# Patient Record
Sex: Female | Born: 1975 | Race: White | Hispanic: No | Smoking: Current every day smoker
Health system: Southern US, Community
[De-identification: ages and names within clinical notes are randomized; demographics above are authoritative.]

## PROBLEM LIST (undated history)

## (undated) DIAGNOSIS — F419 Anxiety disorder, unspecified: Secondary | ICD-10-CM

## (undated) DIAGNOSIS — O09529 Supervision of elderly multigravida, unspecified trimester: Secondary | ICD-10-CM

## (undated) DIAGNOSIS — N390 Urinary tract infection, site not specified: Secondary | ICD-10-CM

## (undated) DIAGNOSIS — D649 Anemia, unspecified: Secondary | ICD-10-CM

## (undated) HISTORY — PX: SALPINGECTOMY: SHX328

## (undated) HISTORY — PX: ABDOMINAL HERNIA REPAIR: SHX539

## (undated) HISTORY — PX: AUGMENTATION MAMMAPLASTY: SUR837

## (undated) HISTORY — PX: BREAST ENHANCEMENT SURGERY: SHX7

---

## 2004-04-18 ENCOUNTER — Other Ambulatory Visit: Payer: Self-pay

## 2006-09-15 DIAGNOSIS — D239 Other benign neoplasm of skin, unspecified: Secondary | ICD-10-CM

## 2006-09-15 HISTORY — DX: Other benign neoplasm of skin, unspecified: D23.9

## 2007-07-20 ENCOUNTER — Encounter: Payer: Self-pay | Admitting: Obstetrics and Gynecology

## 2008-01-15 ENCOUNTER — Ambulatory Visit: Payer: Self-pay | Admitting: Obstetrics and Gynecology

## 2008-01-18 ENCOUNTER — Inpatient Hospital Stay: Payer: Self-pay

## 2010-04-06 ENCOUNTER — Ambulatory Visit: Payer: Self-pay | Admitting: General Practice

## 2010-12-20 ENCOUNTER — Emergency Department: Payer: Self-pay | Admitting: Emergency Medicine

## 2011-08-06 ENCOUNTER — Observation Stay: Payer: Self-pay | Admitting: Obstetrics and Gynecology

## 2011-08-06 LAB — URINALYSIS, COMPLETE
Bilirubin,UR: NEGATIVE
Ph: 6 (ref 4.5–8.0)
Squamous Epithelial: 1
WBC UR: 9 /HPF (ref 0–5)

## 2011-08-06 LAB — HEMOGLOBIN: HGB: 11.2 g/dL — ABNORMAL LOW (ref 12.0–16.0)

## 2011-08-07 LAB — HEMATOCRIT: HCT: 29.9 % — ABNORMAL LOW

## 2012-06-20 ENCOUNTER — Emergency Department: Payer: Self-pay | Admitting: Emergency Medicine

## 2012-06-20 LAB — BASIC METABOLIC PANEL
Anion Gap: 8 (ref 7–16)
Calcium, Total: 8.8 mg/dL (ref 8.5–10.1)
Chloride: 108 mmol/L — ABNORMAL HIGH (ref 98–107)
Co2: 24 mmol/L (ref 21–32)
EGFR (African American): >60
Glucose: 135 mg/dL — ABNORMAL HIGH (ref 65–99)
Osmolality: 282 (ref 275–301)
Potassium: 3.7 mmol/L (ref 3.5–5.1)
Sodium: 140 mmol/L (ref 136–145)

## 2012-06-20 LAB — URINALYSIS, COMPLETE
Bacteria: NONE SEEN
Bilirubin,UR: NEGATIVE
Glucose,UR: NEGATIVE mg/dL (ref 0–75)
Ketone: NEGATIVE
Specific Gravity: 1.02 (ref 1.003–1.030)
WBC UR: 21 /HPF (ref 0–5)

## 2012-06-20 LAB — CBC
HCT: 36.8 % (ref 35.0–47.0)
MCH: 29.4 pg (ref 26.0–34.0)
MCHC: 33.5 g/dL (ref 32.0–36.0)
MCV: 88 fL (ref 80–100)
RBC: 4.19 10*6/uL (ref 3.80–5.20)
RDW: 12.9 % (ref 11.5–14.5)

## 2012-06-22 LAB — URINE CULTURE

## 2012-07-06 DIAGNOSIS — R3129 Other microscopic hematuria: Secondary | ICD-10-CM | POA: Insufficient documentation

## 2012-07-06 DIAGNOSIS — N3020 Other chronic cystitis without hematuria: Secondary | ICD-10-CM | POA: Insufficient documentation

## 2012-07-06 DIAGNOSIS — N3946 Mixed incontinence: Secondary | ICD-10-CM | POA: Insufficient documentation

## 2012-07-06 DIAGNOSIS — N1370 Vesicoureteral-reflux, unspecified: Secondary | ICD-10-CM | POA: Insufficient documentation

## 2012-07-06 DIAGNOSIS — R399 Unspecified symptoms and signs involving the genitourinary system: Secondary | ICD-10-CM | POA: Insufficient documentation

## 2014-11-13 NOTE — Op Note (Signed)
PATIENT NAME:  Christine Abbott, Christine Abbott MR#:  [id number] DATE OF BIRTH:  03-Feb-1976  DATE OF PROCEDURE:  08/06/2011  PREOPERATIVE DIAGNOSES:  1. Abdominal pain.  2. Positive pregnancy test.  3. Adnexal mass.   POSTOPERATIVE DIAGNOSES:  1. Abdominal pain.  2. Positive pregnancy test.  3. Adnexal mass.   PROCEDURE:  1. Dilation and curettage.   2. Diagnostic laparoscopy.  3. Right salpingectomy.   SURGEON: Ricky L. Amalia Hailey, MD   ANESTHESIA: General endotracheal by Dr. Marcello Moores.   FINDINGS: Copious curettings consistent with decidual reaction. Right fallopian tube with accompanying cyst. No evidence of  hemoperitoneum. Grossly normal ovaries. Grossly normal left tube. Grossly normal uterus. Grossly normal appendix.   ESTIMATED BLOOD LOSS: Minimal.   COMPLICATIONS: None.   SPECIMENS: Endometrial curettings and right fallopian tube.   DRAINS: In and out cath with a red rubber catheter at the beginning of the case, approximately 75 mL of clear urine.   PROCEDURE IN DETAIL: The patient presented to the office today complaining of right-sided pain, some mild rebound symptoms. Ultrasound showed no fluid but a 3.5-cm right adnexal mass adjacent to the right ovary. The pregnancy test came back positive for quantitative beta-hCG of over 5200. Given findings and the patient's symptoms, we elected to proceed with the above procedures. We reviewed with the patient and consent was signed.  She was taken to the operating room  after 1 gram of Ancef in IV was given. She was placed in the supine position where anesthesia was initiated. She was then placed in the dorsal lithotomy position using Allen stirrups, prepped and draped in the usual sterile fashion. Cervix was visualized, grasp with a single-tooth tenaculum, easily dilated to permit sharp curettings, which was carried out and specimens were sent for permanent pathology.  The single-tooth tenaculum was changed out for a Hulka tenaculum. The bladder was  drained and we turned our attention to the abdomen.   She is status post an abdominoplasty and had an umbilical mesh placed. I used a 5-mm port and went infraumbilically and no mesh was encountered, apparently going caudad to the edge of the mesh. Pneumoperitoneum was allowed to establish.  The patient was placed in Trendelenburg. Under direct visualization I placed a 5-mm port in the left lower quadrant.   I inspected the pelvis as noted above. I elected to proceed with right salpingectomy as discussed previously, which was carried out alternating Kleppinger and sharp. Pressure was lowered to 6 mmHg. Some additional cauterization in the mesosalpinx to ensure hemostasis was done. The areas were seen to be hemostatic. Ports were removed. Pneumoperitoneum was allowed to resolve. Incisions were closed deep and subcutaneous with 3-0 Vicryl. Steri-Strips and Band-Aids were placed.   All instrument, needle, and sponge counts were correct. The patient tolerated the procedure well. 30 mL of 0.5% Sensorcaine was used in three 10-mL aliquots.   She will be placed on the floor for observation this evening.  I anticipate discharge home tomorrow. We will follow her quants down to zero, pending pathology findings.   ____________________________ Rockey Situ. Amalia Hailey, MD rle:bjt D: 08/06/2011 19:37:41 ET T: 08/07/2011 06:16:53 ET JOB#: [id number]  cc: Audry Pili L. Amalia Hailey, MD, <Dictator> Selmer Dominion MD ELECTRONICALLY SIGNED 08/13/2011 9:49

## 2015-05-29 DIAGNOSIS — E669 Obesity, unspecified: Secondary | ICD-10-CM | POA: Insufficient documentation

## 2015-07-23 DIAGNOSIS — O09529 Supervision of elderly multigravida, unspecified trimester: Secondary | ICD-10-CM

## 2015-07-23 HISTORY — DX: Supervision of elderly multigravida, unspecified trimester: O09.529

## 2015-11-06 DIAGNOSIS — O09521 Supervision of elderly multigravida, first trimester: Secondary | ICD-10-CM | POA: Insufficient documentation

## 2015-11-07 ENCOUNTER — Other Ambulatory Visit: Payer: Self-pay | Admitting: Obstetrics and Gynecology

## 2015-11-07 DIAGNOSIS — Z369 Encounter for antenatal screening, unspecified: Secondary | ICD-10-CM

## 2015-11-19 DIAGNOSIS — Z9889 Other specified postprocedural states: Secondary | ICD-10-CM | POA: Insufficient documentation

## 2015-11-30 ENCOUNTER — Ambulatory Visit: Payer: Self-pay

## 2016-01-12 ENCOUNTER — Other Ambulatory Visit: Payer: Self-pay

## 2016-01-12 DIAGNOSIS — O4442 Low lying placenta NOS or without hemorrhage, second trimester: Secondary | ICD-10-CM

## 2016-01-15 ENCOUNTER — Inpatient Hospital Stay: Admission: RE | Admit: 2016-01-15 | Payer: Self-pay | Source: Ambulatory Visit

## 2016-01-15 ENCOUNTER — Ambulatory Visit: Payer: Self-pay

## 2016-01-15 ENCOUNTER — Ambulatory Visit
Admission: RE | Admit: 2016-01-15 | Discharge: 2016-01-15 | Disposition: A | Discharge status: Home / Self Care | Payer: Medicaid Other | Source: Ambulatory Visit | Attending: Maternal & Fetal Medicine | Admitting: Maternal & Fetal Medicine

## 2016-01-15 DIAGNOSIS — O09522 Supervision of elderly multigravida, second trimester: Secondary | ICD-10-CM | POA: Insufficient documentation

## 2016-01-15 DIAGNOSIS — Z3A20 20 weeks gestation of pregnancy: Secondary | ICD-10-CM | POA: Diagnosis not present

## 2016-01-15 DIAGNOSIS — O4442 Low lying placenta NOS or without hemorrhage, second trimester: Secondary | ICD-10-CM | POA: Diagnosis present

## 2016-01-15 HISTORY — DX: Supervision of elderly multigravida, unspecified trimester: O09.529

## 2016-03-04 ENCOUNTER — Ambulatory Visit
Admission: RE | Admit: 2016-03-04 | Discharge: 2016-03-04 | Disposition: A | Discharge status: Home / Self Care | Payer: Medicaid Other | Source: Ambulatory Visit | Attending: Maternal & Fetal Medicine | Admitting: Maternal & Fetal Medicine

## 2016-03-04 DIAGNOSIS — O4443 Low lying placenta NOS or without hemorrhage, third trimester: Secondary | ICD-10-CM | POA: Diagnosis present

## 2016-03-04 DIAGNOSIS — Z3A28 28 weeks gestation of pregnancy: Secondary | ICD-10-CM | POA: Diagnosis not present

## 2016-03-04 DIAGNOSIS — O4442 Low lying placenta NOS or without hemorrhage, second trimester: Secondary | ICD-10-CM

## 2016-05-20 NOTE — H&P (Signed)
Christine Abbott is a 40 y.o. female presenting for elective repeat c/s  And unilateral tubal ligation . EDC 05/28/16 . OB History    Gravida Para Term Preterm AB Living   5 3 3   1 3    SAB TAB Ectopic Multiple Live Births       1   3     Past Medical History:  Diagnosis Date  . Advanced maternal age in multigravida 2017   Past Surgical History:  Procedure Laterality Date  . ABDOMINAL HERNIA REPAIR    . BREAST ENHANCEMENT SURGERY    . CESAREAN SECTION    . SALPINGECTOMY     Family History: family history is not on file. Social History:  reports that she has quit smoking. She has never used smokeless tobacco. She reports that she does not drink alcohol or use drugs.     Maternal Diabetes: No Genetic Screening: Normal Maternal Ultrasounds/Referrals: Normal, MFM u/s shows no evidence of her anterior placenta communicating with the lower uterine segment  Fetal Ultrasounds or other Referrals:  None Maternal Substance Abuse:  No Significant Maternal Medications:  None Significant Maternal Lab Results:  None Other Comments:  None  ROS Historys/p right salpingectomy in past    Last menstrual period 08/28/2015. Exam  Lungs CTA  CV RRR Abd gravid  Physical Exam  Prenatal labs: ABO, Rh:  O+ Antibody:  neg Rubella:  NOn IMMune RPR:   neg  HBsAg:   neg  HIV:   neg GBS:   neg  Assessment/Plan: Elective repeat LTCS and left tubal for sterilization    SCHERMERHORN,THOMAS 05/20/2016, 9:30 AM

## 2016-05-21 ENCOUNTER — Other Ambulatory Visit: Payer: Medicaid Other

## 2016-05-23 ENCOUNTER — Encounter
Admission: RE | Admit: 2016-05-23 | Discharge: 2016-05-23 | Disposition: A | Discharge status: Home / Self Care | Payer: Medicaid Other | Source: Ambulatory Visit | Attending: Obstetrics and Gynecology | Admitting: Obstetrics and Gynecology

## 2016-05-23 HISTORY — DX: Anemia, unspecified: D64.9

## 2016-05-23 HISTORY — DX: Anxiety disorder, unspecified: F41.9

## 2016-05-23 LAB — RAPID HIV SCREEN (HIV 1/2 AB+AG)
HIV 1/2 ANTIBODIES: NONREACTIVE
HIV-1 P24 ANTIGEN - HIV24: NONREACTIVE

## 2016-05-23 LAB — CBC
HCT: 34.7 % — ABNORMAL LOW (ref 35.0–47.0)
Hemoglobin: 12 g/dL (ref 12.0–16.0)
MCH: 30 pg (ref 26.0–34.0)
MCHC: 34.7 g/dL (ref 32.0–36.0)
MCV: 86.7 fL (ref 80.0–100.0)
PLATELETS: 266 10*3/uL (ref 150–440)
RBC: 4 MIL/uL (ref 3.80–5.20)
RDW: 13.5 % (ref 11.5–14.5)
WBC: 11.1 10*3/uL — AB (ref 3.6–11.0)

## 2016-05-23 LAB — SURGICAL PCR SCREEN
MRSA, PCR: POSITIVE — AB
Staphylococcus aureus: POSITIVE — AB

## 2016-05-23 LAB — TYPE AND SCREEN
ABO/RH(D): O POS
Antibody Screen: NEGATIVE
EXTEND SAMPLE REASON: UNDETERMINED

## 2016-05-23 NOTE — Patient Instructions (Signed)
Your procedure is scheduled on: 05/24/16 Report to [facility] AT 5:45 [facility] EMERGENCY DEPT   Remember: Instructions that are not followed completely may result in serious medical risk, up to and including death, or upon the discretion of your surgeon and anesthesiologist your surgery may need to be rescheduled.    __X__ 1. Do not eat food or drink liquids after midnight. No gum chewing or hard candies.     __X__ 2. No Alcohol for 24 hours before or after surgery.   ____ 3. Bring all medications with you on the day of surgery if instructed.    __X__ 4. Notify your doctor if there is any change in your medical condition     (cold, fever, infections).     Do not wear jewelry, make-up, hairpins, clips or nail polish.  Do not wear lotions, powders, or perfumes.   Do not shave 48 hours prior to surgery. Men may shave face and neck.  Do not bring valuables to the hospital.    [facility] is not responsible for any belongings or valuables.               Contacts, dentures or bridgework may not be worn into surgery.  Leave your suitcase in the car. After surgery it may be brought to your room.  For patients admitted to the hospital, discharge time is determined by your                treatment team.   Patients discharged the day of surgery will not be allowed to drive home.   Please read over the following fact sheets that you were given:   Pain Booklet and MRSA Information   ____ Take these medicines the morning of surgery with A SIP OF WATER:    1.   2.   3.   4.  5.  6.  ____ Fleet Enema (as directed)   __X__ Use CHG Soap as directed/SAGE WIPES  ____ Use inhalers on the day of surgery  ____ Stop metformin 2 days prior to surgery    ____ Take 1/2 of usual insulin dose the night before surgery and none on the morning of surgery.   ____ Stop Coumadin/Plavix/aspirin on   ____ Stop Anti-inflammatories on    ____ Stop supplements until after surgery.    ____ Bring C-Pap to  the hospital.

## 2016-05-23 NOTE — Pre-Procedure Instructions (Signed)
[facility]irthplace notified patient PCR positive for MRSA, results faxed to Dr Ouida Sills.

## 2016-05-24 ENCOUNTER — Inpatient Hospital Stay: Payer: Medicaid Other | Admitting: Anesthesiology

## 2016-05-24 ENCOUNTER — Inpatient Hospital Stay
Admission: RE | Admit: 2016-05-24 | Discharge: 2016-05-27 | DRG: 765 | Disposition: A | Discharge status: Home / Self Care | Payer: Medicaid Other | Source: Ambulatory Visit | Attending: Obstetrics and Gynecology | Admitting: Obstetrics and Gynecology

## 2016-05-24 ENCOUNTER — Encounter
Admission: RE | Disposition: A | Discharge status: Home / Self Care | Payer: Self-pay | Source: Ambulatory Visit | Attending: Obstetrics and Gynecology

## 2016-05-24 DIAGNOSIS — K567 Ileus, unspecified: Secondary | ICD-10-CM | POA: Diagnosis not present

## 2016-05-24 DIAGNOSIS — O9081 Anemia of the puerperium: Secondary | ICD-10-CM | POA: Diagnosis not present

## 2016-05-24 DIAGNOSIS — Z3A39 39 weeks gestation of pregnancy: Secondary | ICD-10-CM | POA: Diagnosis not present

## 2016-05-24 DIAGNOSIS — K9189 Other postprocedural complications and disorders of digestive system: Secondary | ICD-10-CM | POA: Diagnosis not present

## 2016-05-24 DIAGNOSIS — Z302 Encounter for sterilization: Secondary | ICD-10-CM

## 2016-05-24 DIAGNOSIS — D62 Acute posthemorrhagic anemia: Secondary | ICD-10-CM | POA: Diagnosis not present

## 2016-05-24 DIAGNOSIS — Z9889 Other specified postprocedural states: Secondary | ICD-10-CM

## 2016-05-24 DIAGNOSIS — Z87891 Personal history of nicotine dependence: Secondary | ICD-10-CM | POA: Diagnosis not present

## 2016-05-24 DIAGNOSIS — O34211 Maternal care for low transverse scar from previous cesarean delivery: Principal | ICD-10-CM | POA: Diagnosis present

## 2016-05-24 LAB — RPR: RPR Ser Ql: NONREACTIVE

## 2016-05-24 SURGERY — Surgical Case
Anesthesia: Spinal | Laterality: Left | Wound class: Clean Contaminated

## 2016-05-24 MED ORDER — WITCH HAZEL-GLYCERIN EX PADS: 1.0000 "application " | MEDICATED_PAD | CUTANEOUS | Status: DC | PRN

## 2016-05-24 MED ORDER — IBUPROFEN 600 MG PO TABS
600.0000 mg | ORAL_TABLET | Freq: Four times a day (QID) | ORAL | Status: DC | PRN
  Administered 2016-05-27: 600 mg via ORAL
  Filled 2016-05-24: qty 1

## 2016-05-24 MED ORDER — BUPIVACAINE 0.25 % ON-Q PUMP DUAL CATH 400 ML
400.0000 mL | INJECTION | Status: DC
  Filled 2016-05-24: qty 400

## 2016-05-24 MED ORDER — COCONUT OIL OIL: 1.0000 "application " | TOPICAL_OIL | Status: DC | PRN

## 2016-05-24 MED ORDER — BUPIVACAINE HCL (PF) 0.5 % IJ SOLN
INTRAMUSCULAR | Status: AC
  Filled 2016-05-24: qty 30

## 2016-05-24 MED ORDER — ZOLPIDEM TARTRATE 5 MG PO TABS: 5.0000 mg | ORAL_TABLET | Freq: Every evening | ORAL | Status: DC | PRN

## 2016-05-24 MED ORDER — ONDANSETRON HCL 4 MG/2ML IJ SOLN
4.0000 mg | Freq: Once | INTRAMUSCULAR | Status: AC | PRN
Start: 2016-05-24 — End: 2016-05-25
  Administered 2016-05-25: 4 mg via INTRAVENOUS
  Filled 2016-05-24: qty 2

## 2016-05-24 MED ORDER — ONDANSETRON HCL 4 MG/2ML IJ SOLN
4.0000 mg | Freq: Three times a day (TID) | INTRAMUSCULAR | Status: DC | PRN
  Administered 2016-05-25: 4 mg via INTRAVENOUS
  Filled 2016-05-24: qty 2

## 2016-05-24 MED ORDER — DIBUCAINE 1 % RE OINT: 1.0000 "application " | TOPICAL_OINTMENT | RECTAL | Status: DC | PRN

## 2016-05-24 MED ORDER — SOD CITRATE-CITRIC ACID 500-334 MG/5ML PO SOLN
30.0000 mL | Freq: Once | ORAL | Status: AC
  Administered 2016-05-24: 30 mL via ORAL
  Filled 2016-05-24: qty 15

## 2016-05-24 MED ORDER — EPHEDRINE SULFATE-NACL 50-0.9 MG/10ML-% IV SOSY
PREFILLED_SYRINGE | INTRAVENOUS | Status: DC | PRN
  Administered 2016-05-24: 10 mg via INTRAVENOUS

## 2016-05-24 MED ORDER — OXYCODONE-ACETAMINOPHEN 5-325 MG PO TABS: 1.0000 | ORAL_TABLET | ORAL | Status: DC | PRN

## 2016-05-24 MED ORDER — OXYTOCIN 40 UNITS IN LACTATED RINGERS INFUSION - SIMPLE MED
INTRAVENOUS | Status: DC | PRN
  Administered 2016-05-24: 20 mL via INTRAVENOUS
  Administered 2016-05-24: 40 mL via INTRAVENOUS

## 2016-05-24 MED ORDER — SIMETHICONE 80 MG PO CHEW
80.0000 mg | CHEWABLE_TABLET | ORAL | Status: DC
  Administered 2016-05-25: 80 mg via ORAL

## 2016-05-24 MED ORDER — NALBUPHINE HCL 10 MG/ML IJ SOLN: 5.0000 mg | INTRAMUSCULAR | Status: DC | PRN

## 2016-05-24 MED ORDER — NALBUPHINE HCL 10 MG/ML IJ SOLN: 5.0000 mg | Freq: Once | INTRAMUSCULAR | Status: DC | PRN

## 2016-05-24 MED ORDER — SIMETHICONE 80 MG PO CHEW
80.0000 mg | CHEWABLE_TABLET | Freq: Three times a day (TID) | ORAL | Status: DC
  Administered 2016-05-25 – 2016-05-27 (×8): 80 mg via ORAL
  Filled 2016-05-24 (×10): qty 1

## 2016-05-24 MED ORDER — LACTATED RINGERS IV SOLN
INTRAVENOUS | Status: DC
  Administered 2016-05-24: 08:00:00 via INTRAVENOUS

## 2016-05-24 MED ORDER — LACTATED RINGERS IV SOLN
Freq: Once | INTRAVENOUS | Status: AC
  Administered 2016-05-24: 06:00:00 via INTRAVENOUS

## 2016-05-24 MED ORDER — SIMETHICONE 80 MG PO CHEW: 80.0000 mg | CHEWABLE_TABLET | ORAL | Status: DC | PRN

## 2016-05-24 MED ORDER — FENTANYL CITRATE (PF) 100 MCG/2ML IJ SOLN
INTRAMUSCULAR | Status: AC
  Administered 2016-05-24: 25 ug via INTRAVENOUS
  Filled 2016-05-24: qty 2

## 2016-05-24 MED ORDER — ACETAMINOPHEN 325 MG PO TABS: 650.0000 mg | ORAL_TABLET | ORAL | Status: DC | PRN

## 2016-05-24 MED ORDER — DIPHENHYDRAMINE HCL 25 MG PO CAPS: 25.0000 mg | ORAL_CAPSULE | Freq: Four times a day (QID) | ORAL | Status: DC | PRN

## 2016-05-24 MED ORDER — MEPERIDINE HCL 25 MG/ML IJ SOLN: 6.2500 mg | INTRAMUSCULAR | Status: DC | PRN

## 2016-05-24 MED ORDER — MENTHOL 3 MG MT LOZG: 1.0000 | LOZENGE | OROMUCOSAL | Status: DC | PRN

## 2016-05-24 MED ORDER — DIPHENHYDRAMINE HCL 25 MG PO CAPS: 25.0000 mg | ORAL_CAPSULE | ORAL | Status: DC | PRN

## 2016-05-24 MED ORDER — OXYCODONE-ACETAMINOPHEN 5-325 MG PO TABS
2.0000 | ORAL_TABLET | ORAL | Status: DC | PRN
  Administered 2016-05-25: 2 via ORAL
  Filled 2016-05-24: qty 2

## 2016-05-24 MED ORDER — NALOXONE HCL 0.4 MG/ML IJ SOLN: 0.4000 mg | INTRAMUSCULAR | Status: DC | PRN

## 2016-05-24 MED ORDER — BUPIVACAINE ON-Q PAIN PUMP (FOR ORDER SET NO CHG)
INJECTION | Status: AC
  Filled 2016-05-24: qty 1

## 2016-05-24 MED ORDER — PRENATAL MULTIVITAMIN CH
1.0000 | ORAL_TABLET | Freq: Every day | ORAL | Status: DC
  Administered 2016-05-27: 1 via ORAL
  Filled 2016-05-24 (×2): qty 1

## 2016-05-24 MED ORDER — DIPHENHYDRAMINE HCL 50 MG/ML IJ SOLN: 12.5000 mg | INTRAMUSCULAR | Status: DC | PRN

## 2016-05-24 MED ORDER — MORPHINE SULFATE (PF) 0.5 MG/ML IJ SOLN
INTRAMUSCULAR | Status: DC | PRN
  Administered 2016-05-24: .1 mg via EPIDURAL

## 2016-05-24 MED ORDER — PHENYLEPHRINE HCL 10 MG/ML IJ SOLN
INTRAMUSCULAR | Status: DC | PRN
  Administered 2016-05-24 (×5): 100 ug via INTRAVENOUS

## 2016-05-24 MED ORDER — NALOXONE HCL 2 MG/2ML IJ SOSY
1.0000 ug/kg/h | PREFILLED_SYRINGE | INTRAVENOUS | Status: DC | PRN
  Filled 2016-05-24: qty 2

## 2016-05-24 MED ORDER — PHENYLEPHRINE 40 MCG/ML (10ML) SYRINGE FOR IV PUSH (FOR BLOOD PRESSURE SUPPORT)
PREFILLED_SYRINGE | INTRAVENOUS | Status: DC | PRN
  Administered 2016-05-24 (×4): 100 ug via INTRAVENOUS

## 2016-05-24 MED ORDER — OXYTOCIN 40 UNITS IN LACTATED RINGERS INFUSION - SIMPLE MED
2.5000 [IU]/h | INTRAVENOUS | Status: AC
  Filled 2016-05-24: qty 1000

## 2016-05-24 MED ORDER — LACTATED RINGERS IV SOLN
INTRAVENOUS | Status: DC
  Administered 2016-05-24 – 2016-05-27 (×6): via INTRAVENOUS

## 2016-05-24 MED ORDER — TETANUS-DIPHTH-ACELL PERTUSSIS 5-2.5-18.5 LF-MCG/0.5 IM SUSP: 0.5000 mL | Freq: Once | INTRAMUSCULAR | Status: DC

## 2016-05-24 MED ORDER — SCOPOLAMINE 1 MG/3DAYS TD PT72
1.0000 | MEDICATED_PATCH | Freq: Once | TRANSDERMAL | Status: DC
  Administered 2016-05-25: 1.5 mg via TRANSDERMAL
  Filled 2016-05-24: qty 1

## 2016-05-24 MED ORDER — SODIUM CHLORIDE 0.9% FLUSH: 3.0000 mL | INTRAVENOUS | Status: DC | PRN

## 2016-05-24 MED ORDER — CEFAZOLIN SODIUM-DEXTROSE 2-4 GM/100ML-% IV SOLN
2.0000 g | INTRAVENOUS | Status: AC
  Administered 2016-05-24: 2 g via INTRAVENOUS
  Filled 2016-05-24: qty 100

## 2016-05-24 MED ORDER — MORPHINE SULFATE (PF) 2 MG/ML IV SOLN: 1.0000 mg | INTRAVENOUS | Status: AC | PRN

## 2016-05-24 MED ORDER — BUPIVACAINE HCL (PF) 0.5 % IJ SOLN
INTRAMUSCULAR | Status: DC | PRN
  Administered 2016-05-24: 10 mL

## 2016-05-24 MED ORDER — FENTANYL CITRATE (PF) 100 MCG/2ML IJ SOLN
25.0000 ug | INTRAMUSCULAR | Status: DC | PRN
  Administered 2016-05-24 (×2): 25 ug via INTRAVENOUS

## 2016-05-24 MED ORDER — SENNOSIDES-DOCUSATE SODIUM 8.6-50 MG PO TABS
2.0000 | ORAL_TABLET | ORAL | Status: DC
  Administered 2016-05-25: 2 via ORAL
  Filled 2016-05-24: qty 2

## 2016-05-24 MED ORDER — IBUPROFEN 600 MG PO TABS
600.0000 mg | ORAL_TABLET | Freq: Four times a day (QID) | ORAL | Status: DC
  Administered 2016-05-24 – 2016-05-25 (×4): 600 mg via ORAL
  Filled 2016-05-24 (×4): qty 1

## 2016-05-24 SURGICAL SUPPLY — 23 items
BARRIER ADHS 3X4 INTERCEED (GAUZE/BANDAGES/DRESSINGS) ×2 IMPLANT
CANISTER SUCT 3000ML (MISCELLANEOUS) ×2 IMPLANT
CATH KIT ON-Q SILVERSOAK 5IN (CATHETERS) ×4 IMPLANT
CHLORAPREP W/TINT 26ML (MISCELLANEOUS) ×2 IMPLANT
DRSG TELFA 3X8 NADH (GAUZE/BANDAGES/DRESSINGS) ×2 IMPLANT
ELECT CAUTERY BLADE 6.4 (BLADE) ×2 IMPLANT
ELECT REM PT RETURN 9FT ADLT (ELECTROSURGICAL) ×2
ELECTRODE REM PT RTRN 9FT ADLT (ELECTROSURGICAL) ×1 IMPLANT
GAUZE SPONGE 4X4 12PLY STRL (GAUZE/BANDAGES/DRESSINGS) ×2 IMPLANT
GLOVE BIO SURGEON STRL SZ8 (GLOVE) ×2 IMPLANT
GOWN STRL REUS W/ TWL LRG LVL3 (GOWN DISPOSABLE) ×2 IMPLANT
GOWN STRL REUS W/ TWL XL LVL3 (GOWN DISPOSABLE) ×1 IMPLANT
GOWN STRL REUS W/TWL LRG LVL3 (GOWN DISPOSABLE) ×2
GOWN STRL REUS W/TWL XL LVL3 (GOWN DISPOSABLE) ×1
NS IRRIG 1000ML POUR BTL (IV SOLUTION) ×2 IMPLANT
PACK C SECTION AR (MISCELLANEOUS) ×2 IMPLANT
PAD OB MATERNITY 4.3X12.25 (PERSONAL CARE ITEMS) ×2 IMPLANT
PAD PREP 24X41 OB/GYN DISP (PERSONAL CARE ITEMS) ×2 IMPLANT
STAPLER INSORB 30 2030 C-SECTI (MISCELLANEOUS) ×2 IMPLANT
STRAP SAFETY BODY (MISCELLANEOUS) ×2 IMPLANT
SUT CHROMIC 1 CTX 36 (SUTURE) ×6 IMPLANT
SUT PLAIN GUT 0 (SUTURE) ×2 IMPLANT
SUT VIC AB 0 CT1 36 (SUTURE) ×4 IMPLANT

## 2016-05-24 NOTE — Anesthesia Procedure Notes (Addendum)
Spinal  Start time: 05/24/2016 7:52 AM End time: 05/24/2016 7:54 AM Staffing Anesthesiologist: Alvin Critchley Performed: anesthesiologist  Preanesthetic Checklist Completed: patient identified, site marked, surgical consent, pre-op evaluation, timeout performed, IV checked, risks and benefits discussed and monitors and equipment checked Spinal Block Patient position: sitting Prep: Betadine and site prepped and draped Patient monitoring: cardiac monitor, heart rate, continuous pulse ox and blood pressure Approach: midline Location: L4-5 Injection technique: single-shot Needle Needle type: Whitacre  Needle gauge: 25 G Needle length: 9 cm Additional Notes Time out called.  Patient placed in sitting position.  Back prepped and draped in sterile fashion.  A skin wheal was made in the L4-L5 interspace with 1% Lidocaine plain.  A 25G Whit needle was guided into the subarachnoid space with return of clear, colorless CSF in all 4 quadrants.  No blood or paresthesias.  12 mg of spinal marcaine + 0.1 mg of Astromorph was injected .  Patient tolerated the procedure well.

## 2016-05-24 NOTE — Op Note (Signed)
Christine Abbott, Christine Abbott               ACCOUNT NO.:  [id number]  MEDICAL RECORD NO.:  [id number]  LOCATION:                                 FACILITY:  PHYSICIAN:  Laverta Baltimore, MDDATE OF BIRTH:  April 23, 1976  DATE OF PROCEDURE: DATE OF DISCHARGE:                              OPERATIVE REPORT   PREOPERATIVE DIAGNOSIS: 1. Elective repeat cesarean section. 2. 39+ 3 weeks estimated gestational age. 3. Elective unilateral tubal ligation.  POSTOPERATIVE DIAGNOSIS: 1. Elective repeat cesarean section. 2. 39+ 3 weeks estimated gestational age. 3. Elective unilateral tubal ligation.  PROCEDURE PERFORMED: 1. Repeat low transverse cesarean section. 2. Left partial salpingectomy. 3. On-Q pump placement.  SURGEON:  Laverta Baltimore, MD  ANESTHESIA:  Spinal.  SURGEON:  Laverta Baltimore, MD  FIRST ASSISTANTTiburcio Pea, scrub tech.  INDICATION:  A 40 year old, gravida 5, para 3 patient with previous cesarean sections.  The patient elects for repeat cesarean section and sterilization.  The patient reconfirms those desires, the date of the procedure.  Patient has had a prior right salpingectomy.  DESCRIPTION OF PROCEDURE:  After adequate spinal anesthesia, patient was placed in dorsal supine position.  Hip rolled on the right side.  The patient's abdomen was prepped and draped in normal sterile fashion.  The patient did receive 2 g IV Ancef prior to commencement of the case. Time-out was performed.  A Pfannenstiel incision was made 2 fingerbreadths above the symphysis pubis.  Sharp dissection was used to identify the fascia.  Fascia was opened in the midline and opened in a transverse fashion.  The superior aspect of the fascia was grasped with Kocher clamps and the recti muscles dissected free.  Inferior aspect of the fascia was grasped with Kocher clamps and the pyramidalis muscle was dissected free.  Peritoneal cavity was opened sharply and a low- transverse uterine incision  was made through an attenuated lower uterine segment.  Upon entry into the endometrial cavity, clear fluid resulted. The incision was extended with blunt transverse traction.  Fetal head was brought to the incision and head shoulders and body were delivered without difficulty.  The time of birth was 8:05 on May 24, 2016. Infant was dried on the abdomen and oropharynx was suctioned.  After 60 seconds of delayed cord clamping, the cord was clamped and vigorous female was passed to nursery staff who assigned Apgar scores of 9 and 9.  Cord blood was taken.  The placenta was manually delivered and the uterus was exteriorized and the endometrial cavity was wiped clean with laparotomy tape.  The cervix was opened with a ring forceps and the uterine incision was then closed with 1 chromic suture in a running locking fashion.  Good approximation of edges.  Good hemostasis noted. Attention was directed to the left fallopian tube which was grasped at the midportion with a Babcock clamp.  Two separate 0 plain gut sutures were placed around the fallopian tube and a 1.5 cm portion of fallopian tube was removed.  The right fallopian tube was absent.  Ovaries appeared normal.  Posterior cul-de-sac was irrigated and suctioned and the uterus was placed back into the abdominal cavity.  The paracolic gutters were wiped clean with  laparotomy tape.  The tubal ligation site appeared hemostatic as well as the uterine incision.  Interceed was placed over the uterine incision in a T-shaped fashion.  The superior aspect of the fascia was regrasped with Kocher clamps and the On-Q pump catheters were advanced from infraumbilical position to a subfascial position.  Each catheter was then laid beneath the fascia and the fascia was closed over top the catheters with 0 Vicryl suture in a running nonlocking fashion.  Good hemostasis was noted.  Subcutaneous tissues were released due to prior scar tissue and Bovie for  hemostasis and the skin was reapproximated with staples.  Good cosmetic effect.  Each On-Q pump catheter was then secured at the skin level with Dermabond and Steri-Strips to the skin and Tegaderm was placed over the 2 catheters and each catheter was loaded with 5 mL of 0.5% Marcaine.  There were no complications.  ESTIMATED BLOOD LOSS:  400 mL.  URINE OUTPUT:  200 mL.  INTRAOPERATIVE FLUIDS:  1000 mL.  Patient tolerated the procedure well, was taken to recovery room in good condition.    ______________________________ Laverta Baltimore, MD   ______________________________ Laverta Baltimore, MD    TS/MEDQ  D:  05/24/2016  T:  05/24/2016  Job:  [id number]

## 2016-05-24 NOTE — Anesthesia Preprocedure Evaluation (Signed)
Anesthesia Evaluation  Patient identified by MRN, date of birth, ID band Patient awake    Reviewed: Allergy & Precautions, NPO status , Patient's Chart, lab work & pertinent test results  Airway Mallampati: II       Dental no notable dental hx.    Pulmonary former smoker,    Pulmonary exam normal        Cardiovascular negative cardio ROS Normal cardiovascular exam     Neuro/Psych Anxiety negative neurological ROS     GI/Hepatic negative GI ROS, Neg liver ROS,   Endo/Other  negative endocrine ROS  Renal/GU negative Renal ROS  negative genitourinary   Musculoskeletal negative musculoskeletal ROS (+)   Abdominal Normal abdominal exam  (+)   Peds negative pediatric ROS (+)  Hematology  (+) anemia ,   Anesthesia Other Findings   Reproductive/Obstetrics (+) Pregnancy                             Anesthesia Physical Anesthesia Plan  ASA: II  Anesthesia Plan: Spinal   Post-op Pain Management:    Induction:   Airway Management Planned: Nasal Cannula  Additional Equipment:   Intra-op Plan:   Post-operative Plan:   Informed Consent: I have reviewed the patients History and Physical, chart, labs and discussed the procedure including the risks, benefits and alternatives for the proposed anesthesia with the patient or authorized representative who has indicated his/her understanding and acceptance.   Dental advisory given  Plan Discussed with: CRNA and Surgeon  Anesthesia Plan Comments:         Anesthesia Quick Evaluation

## 2016-05-24 NOTE — Anesthesia Procedure Notes (Signed)
Date/Time: 05/24/2016 7:40 AM Performed by: Allean Found Pre-anesthesia Checklist: Patient identified, Emergency Drugs available, Suction available, Patient being monitored and Timeout performed Patient Re-evaluated:Patient Re-evaluated prior to inductionOxygen Delivery Method: Nasal cannula

## 2016-05-24 NOTE — Brief Op Note (Signed)
05/24/2016  8:41 AM  PATIENT:  Christine Abbott  40 y.o. female  PRE-OPERATIVE DIAGNOSIS:  Prior C-Section  Unilateral Tubal Ligation  POST-OPERATIVE DIAGNOSIS: same  PROCEDURE:  Procedure(s): CESAREAN SECTION WITH BILATERAL TUBAL LIGATION (Left) LTCS + left tubal ligation , on q placement  SURGEON:  Surgeon(s) and Role:    Boykin Nearing, MD - Primary  PHYSICIAN ASSISTANT: Brame , scrub tech   ASSISTANTS: none   ANESTHESIA:   spinal  Vigorous female delivered at [facility] on 05/24/16 . APGARS 9/9 . Weight #7/13 EBL:  Total I/O In: 1000 [I.V.:1000] Out: 600 [Urine:200; Blood:400]  BLOOD ADMINISTERED:none  DRAINS: Urinary Catheter (Foley)   LOCAL MEDICATIONS USED:  MARCAINE   , on q pump   SPECIMEN:  Source of Specimen:  left fallopian tube , portion   DISPOSITION OF SPECIMEN:  PATHOLOGY  COUNTS:  YES  TOURNIQUET:  * No tourniquets in log *  DICTATION: .Other Dictation: Dictation Number verbal   PLAN OF CARE: Admit to inpatient   PATIENT DISPOSITION:  PACU - hemodynamically stable.   Delay start of Pharmacological VTE agent (>24hrs) due to surgical blood loss or risk of bleeding: not applicable

## 2016-05-24 NOTE — Progress Notes (Signed)
Patient ID: Christine Abbott, female   DOB: [birth year], 40 y.o.   MRN: [id number] Ready for repeat LTCS and unilateral tubal . MRSA + no active infections . All questions answered . Ready to proceed

## 2016-05-24 NOTE — Transfer of Care (Signed)
Immediate Anesthesia Transfer of Care Note  Patient: Christine Abbott  Procedure(s) Performed: Procedure(s): CESAREAN SECTION WITH UNILATERAL TUBAL LIGATION (Left)  Patient Location: PACU  Anesthesia Type:General  Level of Consciousness: awake  Airway & Oxygen Therapy: Patient Spontanous Breathing and Patient connected to nasal cannula oxygen  Post-op Assessment: Report given to RN and Post -op Vital signs reviewed and stable  Post vital signs: Reviewed and stable  Last Vitals:  Vitals:   05/24/16 0605 05/24/16 0847  BP:  (!) 103/56  Pulse:  99  Resp:  16  Temp: 36.8 C (!) 35.8 C    Last Pain:  Vitals:   05/24/16 0847  TempSrc: Axillary         Complications: No apparent anesthesia complications

## 2016-05-25 LAB — CBC
HCT: 33.3 % — ABNORMAL LOW (ref 35.0–47.0)
Hemoglobin: 11.5 g/dL — ABNORMAL LOW (ref 12.0–16.0)
MCH: 30 pg (ref 26.0–34.0)
MCHC: 34.7 g/dL (ref 32.0–36.0)
MCV: 86.4 fL (ref 80.0–100.0)
PLATELETS: 244 10*3/uL (ref 150–440)
RBC: 3.85 MIL/uL (ref 3.80–5.20)
RDW: 13.7 % (ref 11.5–14.5)
WBC: 16.8 10*3/uL — ABNORMAL HIGH (ref 3.6–11.0)

## 2016-05-25 LAB — ABO/RH: ABO/RH(D): O POS

## 2016-05-25 MED ORDER — ACETAMINOPHEN 10 MG/ML IV SOLN
1000.0000 mg | Freq: Three times a day (TID) | INTRAVENOUS | Status: AC
  Administered 2016-05-25 – 2016-05-26 (×3): 1000 mg via INTRAVENOUS
  Filled 2016-05-25 (×4): qty 100

## 2016-05-25 MED ORDER — KETOROLAC TROMETHAMINE 30 MG/ML IJ SOLN
30.0000 mg | Freq: Once | INTRAMUSCULAR | Status: AC
  Administered 2016-05-25: 30 mg via INTRAVENOUS
  Filled 2016-05-25: qty 1

## 2016-05-25 MED ORDER — PHENOL 1.4 % MT LIQD
1.0000 | OROMUCOSAL | Status: DC | PRN
  Filled 2016-05-25: qty 177

## 2016-05-25 MED ORDER — KETOROLAC TROMETHAMINE 30 MG/ML IJ SOLN
30.0000 mg | Freq: Four times a day (QID) | INTRAMUSCULAR | Status: AC
  Administered 2016-05-26 (×3): 30 mg via INTRAVENOUS
  Filled 2016-05-25 (×3): qty 1

## 2016-05-25 MED ORDER — LORAZEPAM 2 MG/ML IJ SOLN
0.5000 mg | Freq: Four times a day (QID) | INTRAMUSCULAR | Status: DC | PRN
  Administered 2016-05-25 – 2016-05-26 (×2): 0.5 mg via INTRAVENOUS
  Filled 2016-05-25 (×2): qty 1

## 2016-05-25 MED ORDER — LACTATED RINGERS IV BOLUS (SEPSIS)
500.0000 mL | Freq: Once | INTRAVENOUS | Status: AC
  Administered 2016-05-25: 500 mL via INTRAVENOUS

## 2016-05-25 MED ORDER — HYDROMORPHONE HCL 1 MG/ML IJ SOLN
0.6000 mg | Freq: Once | INTRAMUSCULAR | Status: AC
  Administered 2016-05-25: 0.6 mg via INTRAVENOUS
  Filled 2016-05-25: qty 1

## 2016-05-25 MED ORDER — HYDROMORPHONE HCL 1 MG/ML IJ SOLN
1.0000 mg | INTRAMUSCULAR | Status: DC | PRN
  Administered 2016-05-25 – 2016-05-27 (×4): 1 mg via INTRAVENOUS
  Filled 2016-05-25 (×4): qty 1

## 2016-05-25 NOTE — Anesthesia Postprocedure Evaluation (Signed)
Anesthesia Post Note  Patient: MARGUERETTE GARSKI  Procedure(s) Performed: Procedure(s) (LRB): CESAREAN SECTION WITH UNILATERAL TUBAL LIGATION (Left)  Patient location during evaluation: Mother Baby Anesthesia Type: Spinal Level of consciousness: awake, awake and alert and oriented Pain management: pain level controlled Vital Signs Assessment: post-procedure vital signs reviewed and stable Respiratory status: spontaneous breathing Postop Assessment: no headache, no backache and patient able to bend at knees Anesthetic complications: no Comments: Patient apparently has an ileus with some abdominal bloating and discomfort    Last Vitals:  Vitals:   05/25/16 0825 05/25/16 1220  BP: 122/80 132/81  Pulse: 89 85  Resp: 18 16  Temp: 37.2 C 36.7 C    Last Pain:  Vitals:   05/25/16 1609  TempSrc:   PainSc: 10-Worst pain ever                 Hollie Bartus

## 2016-05-25 NOTE — Progress Notes (Signed)
Subjective: Postpartum Day 1: Cesarean Delivery Patient reports nausea and vomiting. Vomiting x5 today for total of 38ml of clear fluids. Pain increased since this morning, abdominal distention worsening. She is not currently vomiting, but continues to feel nauseated with increased pain.  0.6mg  iv dilaudid given now with relief IVF running at 162ml/hr  Objective: Vital signs in last 24 hours: Temp:  [98 F (36.7 C)-98.9 F (37.2 C)] 98 F (36.7 C) (11/04 1220) Pulse Rate:  [85-101] 85 (11/04 1220) Resp:  [16-18] 16 (11/04 1220) BP: (98-132)/(64-81) 132/81 (11/04 1220) SpO2:  [96 %-98 %] 97 % (11/04 1220)  Physical Exam:  Abd: Firmly distended to xyphoid and bilateral flanks. No CVA tenderness. No bowel sounds.    Recent Labs  05/23/16 0954 05/25/16 0558  HGB 12.0 11.5*  HCT 34.7* 33.3*    Assessment/Plan: Status post Cesarean section. Postop ileus: Worsening. Discussed pathophysiology, and that I don't currently see any concerns for obstruction or bowel injury.  - IVF bolus 538ml. Continue running at 149ml/hr to replace losses. Will continue I's and O's, but without Foley unless change in fluid status. - Continue NPO - Place NG tube with medications. Will reassess in am and again in 24 hrs. - IV toradol x3 doses and IV acetaminophen for pain control. IV dilaudid prn.   Benjaman Kindler 05/25/2016, 4:51 PM

## 2016-05-25 NOTE — Anesthesia Post-op Follow-up Note (Signed)
  Anesthesia Pain Follow-up Note  Patient: Christine Abbott  Day #: 1  Date of Follow-up: 05/25/2016 Time: 4:36 PM  Last Vitals:  Vitals:   05/25/16 0825 05/25/16 1220  BP: 122/80 132/81  Pulse: 89 85  Resp: 18 16  Temp: 37.2 C 36.7 C    Level of Consciousness: alert  Pain: mild   Side Effects:None  Catheter Site Exam:clean, dry     Plan: D/C from anesthesia care  Sapna Padron

## 2016-05-25 NOTE — Progress Notes (Signed)
On assessment, patients abdomen is very distended. It is significantly larger than it was on her morning assessment. Patient has been complaining of nausea and is complaining of severe abdominal pain. Patient is now actively vomiting. Dr. Leafy Ro notified and is coming to assess the patient.    Hilbert Bible, RN

## 2016-05-25 NOTE — Progress Notes (Addendum)
Subjective: Postpartum Day 1: Cesarean Delivery Patient reports nausea and tolerating PO.  No flatus, +burping, +abdominal distention  Objective: Vital signs in last 24 hours: Temp:  [97.6 F (36.4 C)-98.9 F (37.2 C)] 98.9 F (37.2 C) (11/04 0825) Pulse Rate:  [87-101] 89 (11/04 0825) Resp:  [16-18] 18 (11/04 0825) BP: (98-122)/(64-80) 122/80 (11/04 0825) SpO2:  [96 %-98 %] 98 % (11/04 0825)  Physical Exam:  General: alert, cooperative and appears stated age. Afebrile Lochia: appropriate Abd: Firmly distended with tympany and decreased bowel sounds. No high pitched sounds. Diffusely tender. No rebound.  Uterine Fundus: not palpable under significant abdominal distention Incision: no significant drainage, no dehiscence, no significant erythema DVT Evaluation: No evidence of DVT seen on physical exam.   Recent Labs  05/23/16 0954 05/25/16 0558  HGB 12.0 11.5*  HCT 34.7* 33.3*    Assessment/Plan: Status post Cesarean section. Postoperative course complicated by postop ilieus . Her distention is impressive. - NPO for now except for select po meds and ice chips if tolerated. Oral care prn - minimize narcotics as tolerated - electrolyte replacement prn - if persistent >3 days or frequent vomiting starts, will consider bowel decompression - ambulation q2 hrs as tolerated - chewing gum recommended - continue IVF for replacement - Serial exams  MRSA screen positive - contact precautions  Acute blood loss anemia - po iron replacement when tolerating po  Farah Benish 05/25/2016, 11:33 AM

## 2016-05-26 LAB — CBC WITH DIFFERENTIAL/PLATELET
BASOS ABS: 0 10*3/uL (ref 0–0.1)
Basophils Relative: 0 %
EOS PCT: 1 %
Eosinophils Absolute: 0.2 10*3/uL (ref 0–0.7)
HCT: 35.5 % (ref 35.0–47.0)
Hemoglobin: 11.9 g/dL — ABNORMAL LOW (ref 12.0–16.0)
LYMPHS ABS: 1.6 10*3/uL (ref 1.0–3.6)
LYMPHS PCT: 11 %
MCH: 29.9 pg (ref 26.0–34.0)
MCHC: 33.4 g/dL (ref 32.0–36.0)
MCV: 89.5 fL (ref 80.0–100.0)
MONO ABS: 1.4 10*3/uL — AB (ref 0.2–0.9)
Monocytes Relative: 10 %
Neutro Abs: 11.8 10*3/uL — ABNORMAL HIGH (ref 1.4–6.5)
Neutrophils Relative %: 78 %
PLATELETS: 262 10*3/uL (ref 150–440)
RBC: 3.97 MIL/uL (ref 3.80–5.20)
RDW: 13.8 % (ref 11.5–14.5)
WBC: 15 10*3/uL — ABNORMAL HIGH (ref 3.6–11.0)

## 2016-05-26 LAB — COMPREHENSIVE METABOLIC PANEL
ALBUMIN: 2.4 g/dL — AB (ref 3.5–5.0)
ALT: 18 U/L (ref 14–54)
AST: 22 U/L (ref 15–41)
Alkaline Phosphatase: 91 U/L (ref 38–126)
Anion gap: 5 (ref 5–15)
BUN: 11 mg/dL (ref 6–20)
CO2: 22 mmol/L (ref 22–32)
CREATININE: 0.55 mg/dL (ref 0.44–1.00)
Calcium: 8 mg/dL — ABNORMAL LOW (ref 8.9–10.3)
Chloride: 109 mmol/L (ref 101–111)
GFR calc Af Amer: >60 mL/min (ref >60)
GFR calc non Af Amer: >60 mL/min (ref >60)
GLUCOSE: 85 mg/dL (ref 65–99)
POTASSIUM: 3.7 mmol/L (ref 3.5–5.1)
Sodium: 136 mmol/L (ref 135–145)
TOTAL PROTEIN: 5.8 g/dL — AB (ref 6.5–8.1)

## 2016-05-26 NOTE — Progress Notes (Signed)
Subjective: Postpartum Day 2: Cesarean Delivery Patient reports improved pain, minimal nausea. NG tube to low intermittent suction placed last night with 800 ml of fluid immediately drained. 244ml out overnight. Normal urine output. No fever. She feels much better overall.  Objective: Vital signs in last 24 hours: Temp:  [97.5 F (36.4 C)-98.9 F (37.2 C)] 97.5 F (36.4 C) (11/05 0414) Pulse Rate:  [80-96] 80 (11/05 0414) Resp:  [16-18] 18 (11/05 0414) BP: (108-132)/(64-81) 108/64 (11/05 0414) SpO2:  [95 %-98 %] 96 % (11/05 0414)  Physical Exam:  General: alert, cooperative and appears stated age Lochia: appropriate Uterine Fundus: non palpable Abd: moderately softly distended, no bowel sounds, no high pitched sounds. Incision: healing well, no significant drainage, no dehiscence, no significant erythema DVT Evaluation: No evidence of DVT seen on physical exam.   Recent Labs  05/23/16 0954 05/25/16 0558  HGB 12.0 11.5*  HCT 34.7* 33.3*    Assessment/Plan: Status post Cesarean section. Postoperative course complicated by postop ileus and acute blood loss anemai  Continue current care. - Continue NGT until clinical improvement, passing gas - COntinue NPO (may take chips if counts volume) - Continue iv pain meds PRN- tylenol, Toradol, dilaudid prn - Continue IVF until tolerating po - 136ml/hr  Not breastfeeding S/p BTL  Christine Abbott 05/26/2016, 7:18 AM

## 2016-05-27 ENCOUNTER — Encounter: Payer: Self-pay | Admitting: Obstetrics and Gynecology

## 2016-05-27 LAB — COMPREHENSIVE METABOLIC PANEL
ALBUMIN: 2.3 g/dL — AB (ref 3.5–5.0)
ALT: 15 U/L (ref 14–54)
AST: 20 U/L (ref 15–41)
Alkaline Phosphatase: 96 U/L (ref 38–126)
Anion gap: 8 (ref 5–15)
BUN: 11 mg/dL (ref 6–20)
CHLORIDE: 110 mmol/L (ref 101–111)
CO2: 21 mmol/L — AB (ref 22–32)
Calcium: 7.9 mg/dL — ABNORMAL LOW (ref 8.9–10.3)
Creatinine, Ser: 0.6 mg/dL (ref 0.44–1.00)
GFR calc Af Amer: >60 mL/min (ref >60)
GFR calc non Af Amer: >60 mL/min (ref >60)
GLUCOSE: 76 mg/dL (ref 65–99)
POTASSIUM: 3.7 mmol/L (ref 3.5–5.1)
Sodium: 139 mmol/L (ref 135–145)
Total Bilirubin: 0.2 mg/dL — ABNORMAL LOW (ref 0.3–1.2)
Total Protein: 5.7 g/dL — ABNORMAL LOW (ref 6.5–8.1)

## 2016-05-27 LAB — CBC WITH DIFFERENTIAL/PLATELET
Basophils Absolute: 0 10*3/uL (ref 0–0.1)
Basophils Relative: 0 %
EOS PCT: 2 %
Eosinophils Absolute: 0.2 10*3/uL (ref 0–0.7)
HCT: 31.3 % — ABNORMAL LOW (ref 35.0–47.0)
Hemoglobin: 10.9 g/dL — ABNORMAL LOW (ref 12.0–16.0)
LYMPHS ABS: 1.6 10*3/uL (ref 1.0–3.6)
LYMPHS PCT: 14 %
MCH: 30.4 pg (ref 26.0–34.0)
MCHC: 34.8 g/dL (ref 32.0–36.0)
MCV: 87.3 fL (ref 80.0–100.0)
MONO ABS: 1.1 10*3/uL — AB (ref 0.2–0.9)
MONOS PCT: 10 %
Neutro Abs: 8.4 10*3/uL — ABNORMAL HIGH (ref 1.4–6.5)
Neutrophils Relative %: 74 %
PLATELETS: 258 10*3/uL (ref 150–440)
RBC: 3.59 MIL/uL — AB (ref 3.80–5.20)
RDW: 13.5 % (ref 11.5–14.5)
WBC: 11.3 10*3/uL — ABNORMAL HIGH (ref 3.6–11.0)

## 2016-05-27 LAB — SURGICAL PATHOLOGY

## 2016-05-27 MED ORDER — HYDROCODONE-ACETAMINOPHEN 5-325 MG PO TABS: 2.0000 | ORAL_TABLET | ORAL | Status: DC | PRN

## 2016-05-27 MED ORDER — SODIUM CHLORIDE 0.9% FLUSH: 3.0000 mL | INTRAVENOUS | Status: DC | PRN

## 2016-05-27 MED ORDER — MEASLES, MUMPS & RUBELLA VAC ~~LOC~~ INJ
0.5000 mL | INJECTION | Freq: Once | SUBCUTANEOUS | Status: AC
  Administered 2016-05-27: 0.5 mL via SUBCUTANEOUS
  Filled 2016-05-27 (×3): qty 0.5

## 2016-05-27 MED ORDER — HYDROCODONE-ACETAMINOPHEN 5-325 MG PO TABS: 1.0000 | ORAL_TABLET | ORAL | Status: DC | PRN

## 2016-05-27 MED ORDER — LORAZEPAM 0.5 MG PO TABS
0.5000 mg | ORAL_TABLET | Freq: Four times a day (QID) | ORAL | Status: DC | PRN
  Administered 2016-05-27: 0.5 mg via ORAL
  Filled 2016-05-27: qty 1

## 2016-05-27 MED ORDER — HYDROCODONE-ACETAMINOPHEN 5-325 MG PO TABS: 1.0000 | ORAL_TABLET | ORAL | 0 refills | Status: DC | PRN

## 2016-05-27 NOTE — Progress Notes (Signed)
Patient ID: Christine Abbott, female   DOB: 12/04/1975, 40 y.o.   MRN: [id number]  Patient passing flatus, now with loose stool x2. NG tube removed overnight.

## 2016-05-27 NOTE — Progress Notes (Signed)
POST CESAREAN PROGRESS NOTE POD 3 Subjective:   Doing well. Voiding, ambulating,tolerating pain.  NG tube removed this morning, ~2am, no emesis with just ice chips. +flatus, +BM. Denies: CP SOB F/C, N/V, calf pain    Objective:  Blood pressure 130/79, pulse 74, temperature 99 F (37.2 C), resp. rate 18, height 5\' 3"  (1.6 m), weight 81.2 kg (179 lb), last menstrual period 08/28/2015, SpO2 100 %,   General: NAD Pulmonary: no increased work of breathing Abdomen: non-distended, non-tender, fundus firm at level of umbilicus Incision: bandage removed, c/d/i, staples Extremities: no edema, no erythema, no tenderness  Results for orders placed or performed during the hospital encounter of 05/24/16 (from the past 24 hour(s))  Comprehensive metabolic panel     Status: Abnormal   Collection Time: 05/27/16  6:05 AM  Result Value Ref Range   Sodium 139 135 - 145 mmol/L   Potassium 3.7 3.5 - 5.1 mmol/L   Chloride 110 101 - 111 mmol/L   CO2 21 (L) 22 - 32 mmol/L   Glucose, Bld 76 65 - 99 mg/dL   BUN 11 6 - 20 mg/dL   Creatinine, Ser 0.60 0.44 - 1.00 mg/dL   Calcium 7.9 (L) 8.9 - 10.3 mg/dL   Total Protein 5.7 (L) 6.5 - 8.1 g/dL   Albumin 2.3 (L) 3.5 - 5.0 g/dL   AST 20 15 - 41 U/L   ALT 15 14 - 54 U/L   Alkaline Phosphatase 96 38 - 126 U/L   Total Bilirubin 0.2 (L) 0.3 - 1.2 mg/dL   GFR calc non Af Amer >60 >60 mL/min   GFR calc Af Amer >60 >60 mL/min   Anion gap 8 5 - 15  CBC with Differential/Platelet     Status: Abnormal   Collection Time: 05/27/16  6:05 AM  Result Value Ref Range   WBC 11.3 (H) 3.6 - 11.0 K/uL   RBC 3.59 (L) 3.80 - 5.20 MIL/uL   Hemoglobin 10.9 (L) 12.0 - 16.0 g/dL   HCT 31.3 (L) 35.0 - 47.0 %   MCV 87.3 80.0 - 100.0 fL   MCH 30.4 26.0 - 34.0 pg   MCHC 34.8 32.0 - 36.0 g/dL   RDW 13.5 11.5 - 14.5 %   Platelets 258 150 - 440 K/uL   Neutrophils Relative % 74 %   Neutro Abs 8.4 (H) 1.4 - 6.5 K/uL   Lymphocytes Relative 14 %   Lymphs Abs 1.6 1.0 - 3.6 K/uL   Monocytes Relative 10 %   Monocytes Absolute 1.1 (H) 0.2 - 0.9 K/uL   Eosinophils Relative 2 %   Eosinophils Absolute 0.2 0 - 0.7 K/uL   Basophils Relative 0 %   Basophils Absolute 0.0 0 - 0.1 K/uL   @I /O24@   Assessment:   40 y.o. [id number] postoperativeday # 3 repeat LTCS with BTL with post op ileus, resolving   Plan:  1) Acute blood loss anemia - hemodynamically stable and asymptomatic  2) ILEUS: s/p NG tube, removed early this morning after stools and flatus.  Start clear diet, go slow.  3) TDAP status UTD  4) Breast/Bottle/Contraception:  Formula, BTL  5) Disposition - continue inpatient admission  ----- Larey Days, MD Attending Obstetrician and Gynecologist [facility], Department of [facility]

## 2016-05-27 NOTE — Discharge Instructions (Signed)

## 2016-05-27 NOTE — Discharge Summary (Signed)
Obstetrical Discharge Summary  Patient Name: Christine Abbott DOB: Nov 13, 1975 MRN: [id number]  Date of Admission: 05/24/2016 Date of Discharge: 05/27/2016  Primary OB: [facility]   Gestational Age at Delivery: [redacted]w[redacted]d   Antepartum complications: history of cesarean Admitting Diagnosis: scheduled elective repeat cesarean and tubal ligation Secondary Diagnosis: Patient Active Problem List   Diagnosis Date Noted  . Cesarean delivery delivered 05/24/2016  . Post-operative state 05/24/2016    Augmentation: n/a Complications: None Intrapartum complications/course: routine cesarean Date of Delivery: 05/24/16 Delivered By: Gershon Mussel Schermerhorn Delivery Type: repeat cesarean section, low transverse incision Anesthesia: spinal Placenta: extracted Laceration: n/a Episiotomy: n/a Newborn Data: Live born female  Birth Weight: 7 lb 12.5 oz (3530 g) APGAR: 9, 9   Post partum course: Patient developed significant distention and emesis and was diagnosed clinically with postop ileus.  NG tube was placed and on intermittent suction until flatus and BM on POD3.  After this she tolerated clears and advanced to full diet prior to discharge.  Otherwise she was ambulating, tolerating pain, and voiding spontaneously. Postpartum Procedures: NG tube placement  Discharge Physical Exam:  BP 128/76 (BP Location: Left Arm)   Pulse 74   Temp 98.8 F (37.1 C) (Oral)   Resp 20   Ht 5\' 3"  (1.6 m)   Wt 81.2 kg (179 lb)   LMP 08/28/2015   SpO2 100%   Breastfeeding? no   BMI 31.71 kg/m   General: NAD CV: RRR Pulm: CTABL, nl effort ABD: s/nd/nt, fundus firm and below the umbilicus Lochia: moderate Incision: c/d/i, staples removed DVT Evaluation: LE non-ttp, no evidence of DVT on exam.  Hemoglobin  Date Value Ref Range Status  05/27/2016 10.9 (L) 12.0 - 16.0 g/dL Final   HGB  Date Value Ref Range Status  06/20/2012 12.3 12.0 - 16.0 g/dL Final   HCT  Date Value Ref Range Status   05/27/2016 31.3 (L) 35.0 - 47.0 % Final  06/20/2012 36.8 35.0 - 47.0 % Final     Disposition: stable, discharge to home. Baby Feeding: formula Baby Disposition: home with mom  Rh Immune globulin given: n/a Rubella vaccine given: yes Tdap vaccine given in AP or PP setting: AP Flu vaccine given in AP or PP setting: AP  Contraception: BTL  Prenatal Labs:   Prenatal labs: ABO, Rh:  O+ Antibody:  neg Rubella:  NOn IMMune RPR:   neg  HBsAg:   neg  HIV:   neg GBS:   neg  Plan:  Christine Abbott was discharged to home in good condition. Follow-up appointment at [place] in 2 weeks  Discharge Medications:   Medication List    TAKE these medications   acetaminophen 500 MG tablet Commonly known as:  TYLENOL Take 1,000 mg by mouth every 6 (six) hours as needed for moderate pain or headache.   doxylamine (Sleep) 25 MG tablet Commonly known as:  UNISOM Take 25 mg by mouth at bedtime as needed for sleep.   Ferrous Gluconate 256 (28 Fe) MG Tabs Take 256 mg by mouth daily.   HYDROcodone-acetaminophen 5-325 MG tablet Commonly known as:  NORCO/VICODIN Take 1-2 tablets by mouth every 4 (four) hours as needed.   prenatal multivitamin Tabs tablet Take 1 tablet by mouth daily at 12 noon.         Signed: ----- Larey Days, MD Attending Obstetrician and Gynecologist [facility], Department of [facility]

## 2016-05-27 NOTE — Progress Notes (Signed)
Pt discharged home with infant.  Discharge instructions and follow up appointment given to and reviewed with pt.  Pt verbalized understanding.  Escorted by auxillary. 

## 2016-11-01 DIAGNOSIS — F419 Anxiety disorder, unspecified: Secondary | ICD-10-CM | POA: Insufficient documentation

## 2017-02-18 ENCOUNTER — Encounter: Payer: Self-pay | Admitting: Emergency Medicine

## 2017-02-18 ENCOUNTER — Emergency Department
Admission: EM | Admit: 2017-02-18 | Discharge: 2017-02-18 | Disposition: A | Discharge status: Home / Self Care | Payer: Medicaid Other | Attending: Student in an Organized Health Care Education/Training Program | Admitting: Student in an Organized Health Care Education/Training Program

## 2017-02-18 DIAGNOSIS — Z79899 Other long term (current) drug therapy: Secondary | ICD-10-CM | POA: Insufficient documentation

## 2017-02-18 DIAGNOSIS — R1030 Lower abdominal pain, unspecified: Secondary | ICD-10-CM | POA: Insufficient documentation

## 2017-02-18 DIAGNOSIS — R197 Diarrhea, unspecified: Secondary | ICD-10-CM | POA: Insufficient documentation

## 2017-02-18 DIAGNOSIS — Z87891 Personal history of nicotine dependence: Secondary | ICD-10-CM | POA: Insufficient documentation

## 2017-02-18 DIAGNOSIS — N10 Acute tubulo-interstitial nephritis: Secondary | ICD-10-CM | POA: Insufficient documentation

## 2017-02-18 DIAGNOSIS — R112 Nausea with vomiting, unspecified: Secondary | ICD-10-CM | POA: Insufficient documentation

## 2017-02-18 DIAGNOSIS — R1110 Vomiting, unspecified: Secondary | ICD-10-CM | POA: Diagnosis present

## 2017-02-18 HISTORY — DX: Urinary tract infection, site not specified: N39.0

## 2017-02-18 LAB — COMPREHENSIVE METABOLIC PANEL
ALT: 30 U/L (ref 14–54)
AST: 29 U/L (ref 15–41)
Albumin: 4.3 g/dL (ref 3.5–5.0)
Alkaline Phosphatase: 75 U/L (ref 38–126)
Anion gap: 10 (ref 5–15)
BUN: 12 mg/dL (ref 6–20)
CHLORIDE: 107 mmol/L (ref 101–111)
CO2: 24 mmol/L (ref 22–32)
CREATININE: 0.78 mg/dL (ref 0.44–1.00)
Calcium: 8.9 mg/dL (ref 8.9–10.3)
GFR calc non Af Amer: >60 mL/min (ref >60)
Glucose, Bld: 99 mg/dL (ref 65–99)
Potassium: 3.7 mmol/L (ref 3.5–5.1)
SODIUM: 141 mmol/L (ref 135–145)
Total Bilirubin: 0.5 mg/dL (ref 0.3–1.2)
Total Protein: 7.8 g/dL (ref 6.5–8.1)

## 2017-02-18 LAB — URINALYSIS, COMPLETE (UACMP) WITH MICROSCOPIC
Bilirubin Urine: NEGATIVE
Glucose, UA: NEGATIVE mg/dL
KETONES UR: 5 mg/dL — AB
Leukocytes, UA: NEGATIVE
Nitrite: POSITIVE — AB
PROTEIN: 30 mg/dL — AB
Specific Gravity, Urine: 1.023 (ref 1.005–1.030)
pH: 5 (ref 5.0–8.0)

## 2017-02-18 LAB — CBC
HCT: 44.5 % (ref 35.0–47.0)
Hemoglobin: 15.2 g/dL (ref 12.0–16.0)
MCH: 29.7 pg (ref 26.0–34.0)
MCHC: 34.3 g/dL (ref 32.0–36.0)
MCV: 86.5 fL (ref 80.0–100.0)
Platelets: 312 10*3/uL (ref 150–440)
RBC: 5.14 MIL/uL (ref 3.80–5.20)
RDW: 12.5 % (ref 11.5–14.5)
WBC: 11 10*3/uL (ref 3.6–11.0)

## 2017-02-18 LAB — POCT PREGNANCY, URINE: Preg Test, Ur: NEGATIVE

## 2017-02-18 LAB — PREGNANCY, URINE: PREG TEST UR: NEGATIVE

## 2017-02-18 MED ORDER — SODIUM CHLORIDE 0.9 % IV BOLUS (SEPSIS)
1000.0000 mL | Freq: Once | INTRAVENOUS | Status: AC
  Administered 2017-02-18: 1000 mL via INTRAVENOUS

## 2017-02-18 MED ORDER — CEPHALEXIN 500 MG PO CAPS: 500.0000 mg | ORAL_CAPSULE | Freq: Three times a day (TID) | ORAL | 0 refills | Status: AC

## 2017-02-18 MED ORDER — PROMETHAZINE HCL 12.5 MG PO TABS: 12.5000 mg | ORAL_TABLET | Freq: Four times a day (QID) | ORAL | 0 refills | Status: DC | PRN

## 2017-02-18 MED ORDER — DEXTROSE 5 % IV SOLN
INTRAVENOUS | Status: AC
  Filled 2017-02-18: qty 10

## 2017-02-18 MED ORDER — DEXTROSE 5 % IV SOLN
1.0000 g | Freq: Once | INTRAVENOUS | Status: AC
  Administered 2017-02-18: 1 g via INTRAVENOUS

## 2017-02-18 NOTE — ED Triage Notes (Signed)
C/O right low back pain, nausea, vomiting -- onset of symptoms this am at 06300.  Recent UTI, treated with Amoxicillin.  Finished RX 2 days ago.

## 2017-02-18 NOTE — ED Triage Notes (Signed)
Pt reports burning with urination, lower back pain, nausea, vomiting, diarrhea and fever for the past two days. Hx of UTIs.

## 2017-02-18 NOTE — ED Notes (Signed)
Pt to ed with c/o right flank pain and pressure and frequency of urination for several days.  Reports she has recently been treated for UTI and feels the abx did not relieve her symptoms.  Pt alert and oriented at this time. Skin warm and dry.

## 2017-02-18 NOTE — ED Provider Notes (Addendum)
[facility] Emergency Department Provider Note    First MD Initiated Contact with Patient 02/18/17 1731     (approximate)  I have reviewed the triage vital signs and the nursing notes.   HISTORY  Chief Complaint Emesis; Flank Pain; and Diarrhea    HPI Christine Abbott is a 41 y.o. female history of recurrent urinary tract infections with referral reflux presents with persistent dysuria urinary frequency, pressure and now with right flank pain. Patient is recent start on amoxicillin for urinary tract infection. Patient has allergies to Septra as well as Macrobid. Patient states that she never got any relief from the amoxicillin. She finished that 2 days ago and presented today due to worsening discomfort associated with nausea and one episode of diarrhea. No fevers. She is able to eat and drink. States that she has had Keflex in the past which treated her symptoms.   Past Medical History:  Diagnosis Date  . Advanced maternal age in multigravida 2017  . Anemia   . Anxiety   . UTI (urinary tract infection)    No family history on file. Past Surgical History:  Procedure Laterality Date  . ABDOMINAL HERNIA REPAIR    . BREAST ENHANCEMENT SURGERY    . CESAREAN SECTION    . CESAREAN SECTION WITH BILATERAL TUBAL LIGATION Left 05/24/2016   Procedure: CESAREAN SECTION WITH UNILATERAL TUBAL LIGATION;  Surgeon: Boykin Nearing, MD;  Location: [facility] ORS;  Service: Obstetrics;  Laterality: Left;  . SALPINGECTOMY     Patient Active Problem List   Diagnosis Date Noted  . Cesarean delivery delivered 05/24/2016  . Post-operative state 05/24/2016      Prior to Admission medications   Medication Sig Start Date End Date Taking? Authorizing Provider  acetaminophen (TYLENOL) 500 MG tablet Take 1,000 mg by mouth every 6 (six) hours as needed for moderate pain or headache.    [provider]  cephALEXin (KEFLEX) 500 MG capsule Take 1 capsule (500 mg total)  by mouth 3 (three) times daily. 02/18/17 02/25/17  Merlyn Lot, MD  doxylamine, Sleep, (UNISOM) 25 MG tablet Take 25 mg by mouth at bedtime as needed for sleep.    [provider]  Ferrous Gluconate 256 (28 Fe) MG TABS Take 256 mg by mouth daily.    [provider]  HYDROcodone-acetaminophen (NORCO/VICODIN) 5-325 MG tablet Take 1-2 tablets by mouth every 4 (four) hours as needed. 05/27/16   Ward, Honor Loh, MD  Prenatal Vit-Fe Fumarate-FA (PRENATAL MULTIVITAMIN) TABS tablet Take 1 tablet by mouth daily at 12 noon.    [provider]  promethazine (PHENERGAN) 12.5 MG tablet Take 1 tablet (12.5 mg total) by mouth every 6 (six) hours as needed for nausea or vomiting. 02/18/17   Merlyn Lot, MD    Allergies Ciprofloxacin; Macrobid [nitrofurantoin]; and Sulfa antibiotics    Social History Social History  Substance Use Topics  . Smoking status: Former Research scientist (life sciences)  . Smokeless tobacco: Never Used  . Alcohol use No    Review of Systems Patient denies headaches, rhinorrhea, blurry vision, numbness, shortness of breath, chest pain, edema, cough, abdominal pain, nausea, vomiting, diarrhea, dysuria, fevers, rashes or hallucinations unless otherwise stated above in HPI. ____________________________________________   PHYSICAL EXAM:  VITAL SIGNS: Vitals:   02/18/17 1717  BP: 104/75  Pulse: (!) 109  Resp: 16  Temp: 99.1 F (37.3 C)    Constitutional: Alert and oriented. Well appearing and in no acute distress. Eyes: Conjunctivae are normal.  Head: Atraumatic.  Nose: No congestion/rhinnorhea. Mouth/Throat: Mucous membranes are moist.   Neck: No stridor. Painless ROM.  Cardiovascular: Normal rate, regular rhythm. Grossly normal heart sounds.  Good peripheral circulation. Respiratory: Normal respiratory effort.  No retractions. Lungs CTAB. Gastrointestinal: Soft and nontender in all four quadrants. No distention. No abdominal bruits. No CVA  tenderness. Musculoskeletal: No lower extremity tenderness nor edema.  No joint effusions. Neurologic:  Normal speech and language. No gross focal neurologic deficits are appreciated. No facial droop Skin:  Skin is warm, dry and intact. No rash noted. Psychiatric: Mood and affect are normal. Speech and behavior are normal.  ____________________________________________   LABS (all labs ordered are listed, but only abnormal results are displayed)  Results for orders placed or performed during the hospital encounter of 02/18/17 (from the past 24 hour(s))  CBC     Status: None   Collection Time: 02/18/17  5:17 PM  Result Value Ref Range   WBC 11.0 3.6 - 11.0 K/uL   RBC 5.14 3.80 - 5.20 MIL/uL   Hemoglobin 15.2 12.0 - 16.0 g/dL   HCT 44.5 35.0 - 47.0 %   MCV 86.5 80.0 - 100.0 fL   MCH 29.7 26.0 - 34.0 pg   MCHC 34.3 32.0 - 36.0 g/dL   RDW 12.5 11.5 - 14.5 %   Platelets 312 150 - 440 K/uL  Comprehensive metabolic panel     Status: None   Collection Time: 02/18/17  5:17 PM  Result Value Ref Range   Sodium 141 135 - 145 mmol/L   Potassium 3.7 3.5 - 5.1 mmol/L   Chloride 107 101 - 111 mmol/L   CO2 24 22 - 32 mmol/L   Glucose, Bld 99 65 - 99 mg/dL   BUN 12 6 - 20 mg/dL   Creatinine, Ser 0.78 0.44 - 1.00 mg/dL   Calcium 8.9 8.9 - 10.3 mg/dL   Total Protein 7.8 6.5 - 8.1 g/dL   Albumin 4.3 3.5 - 5.0 g/dL   AST 29 15 - 41 U/L   ALT 30 14 - 54 U/L   Alkaline Phosphatase 75 38 - 126 U/L   Total Bilirubin 0.5 0.3 - 1.2 mg/dL   GFR calc non Af Amer >60 >60 mL/min   GFR calc Af Amer >60 >60 mL/min   Anion gap 10 5 - 15  Urinalysis, Complete w Microscopic     Status: Abnormal   Collection Time: 02/18/17  5:18 PM  Result Value Ref Range   Color, Urine AMBER (A) YELLOW   APPearance CLEAR (A) CLEAR   Specific Gravity, Urine 1.023 1.005 - 1.030   pH 5.0 5.0 - 8.0   Glucose, UA NEGATIVE NEGATIVE mg/dL   Hgb urine dipstick LARGE (A) NEGATIVE   Bilirubin Urine NEGATIVE NEGATIVE   Ketones,  ur 5 (A) NEGATIVE mg/dL   Protein, ur 30 (A) NEGATIVE mg/dL   Nitrite POSITIVE (A) NEGATIVE   Leukocytes, UA NEGATIVE NEGATIVE   RBC / HPF 0-5 0 - 5 RBC/hpf   WBC, UA 0-5 0 - 5 WBC/hpf   Bacteria, UA FEW (A) NONE SEEN   Squamous Epithelial / LPF 0-5 (A) NONE SEEN   Mucous PRESENT   Pregnancy, urine     Status: None   Collection Time: 02/18/17  5:18 PM  Result Value Ref Range   Preg Test, Ur NEGATIVE NEGATIVE  Pregnancy, urine POC     Status: None   Collection Time: 02/18/17  6:29 PM  Result Value Ref Range   Preg Test, Ur  NEGATIVE NEGATIVE   ____________________________________________ ____________________________________________  RADIOLOGY   ____________________________________________   PROCEDURES  Procedure(s) performed:  Procedures    Critical Care performed: no ____________________________________________   INITIAL IMPRESSION / ASSESSMENT AND PLAN / ED COURSE  Pertinent labs & imaging results that were available during my care of the patient were reviewed by me and considered in my medical decision making (see chart for details).  DDX: pyelo, uti, msk strain, pregnancy, unlikley appendicitis or colitis  Christine Abbott is a 41 y.o. who presents to the ED with dysuria and nausea one episode of loose stool and right flank pain. The pain is not colicky in nature. This is not clinically consistent with kidney stone. She has no RBCs on her urinalysis. She's not pregnant. Does have positive nitrites with few bacteria and persistent dysuria. Based on her symptoms will treat her for pyelonephritis with this of IV fluids as well as IV Rocephin. Her abdominal exam soft and benign.  There is no RLQ ttp to suggest appendicitis.  Do not feel that CT imaging clinically indicated given this presentation.  Patient was able to tolerate PO after IVF and was able to ambulate with a steady gait with no discomfort further suggesting against acute intra-abdominal process. Have discussed  with the patient and available family all diagnostics and treatments performed thus far and all questions were answered to the best of my ability. The patient demonstrates understanding and agreement with plan.       ____________________________________________   FINAL CLINICAL IMPRESSION(S) / ED DIAGNOSES  Final diagnoses:  Acute pyelonephritis  Nausea vomiting and diarrhea      NEW MEDICATIONS STARTED DURING THIS VISIT:  New Prescriptions   CEPHALEXIN (KEFLEX) 500 MG CAPSULE    Take 1 capsule (500 mg total) by mouth 3 (three) times daily.   PROMETHAZINE (PHENERGAN) 12.5 MG TABLET    Take 1 tablet (12.5 mg total) by mouth every 6 (six) hours as needed for nausea or vomiting.     Note:  This document was prepared using Dragon voice recognition software and may include unintentional dictation errors.    Merlyn Lot, MD 02/18/17 Kristian Covey    Merlyn Lot, MD 02/18/17 [phone]

## 2017-09-25 ENCOUNTER — Telehealth: Payer: Self-pay | Admitting: Emergency Medicine

## 2017-09-25 ENCOUNTER — Inpatient Hospital Stay
Admission: EM | Admit: 2017-09-25 | Discharge: 2017-09-28 | DRG: 872 | Disposition: A | Discharge status: Home / Self Care | Payer: Medicaid Other | Attending: Internal Medicine | Admitting: Internal Medicine

## 2017-09-25 ENCOUNTER — Emergency Department
Admission: EM | Admit: 2017-09-25 | Discharge: 2017-09-25 | Disposition: A | Discharge status: Home / Self Care | Payer: Medicaid Other | Source: Home / Self Care | Attending: Emergency Medicine | Admitting: Emergency Medicine

## 2017-09-25 ENCOUNTER — Emergency Department: Payer: Medicaid Other

## 2017-09-25 ENCOUNTER — Other Ambulatory Visit: Payer: Self-pay

## 2017-09-25 ENCOUNTER — Encounter: Payer: Self-pay | Admitting: Emergency Medicine

## 2017-09-25 DIAGNOSIS — Z881 Allergy status to other antibiotic agents status: Secondary | ICD-10-CM | POA: Diagnosis not present

## 2017-09-25 DIAGNOSIS — A419 Sepsis, unspecified organism: Secondary | ICD-10-CM

## 2017-09-25 DIAGNOSIS — Z87440 Personal history of urinary (tract) infections: Secondary | ICD-10-CM

## 2017-09-25 DIAGNOSIS — Z882 Allergy status to sulfonamides status: Secondary | ICD-10-CM

## 2017-09-25 DIAGNOSIS — F419 Anxiety disorder, unspecified: Secondary | ICD-10-CM | POA: Diagnosis present

## 2017-09-25 DIAGNOSIS — A4151 Sepsis due to Escherichia coli [E. coli]: Principal | ICD-10-CM | POA: Diagnosis present

## 2017-09-25 DIAGNOSIS — F17200 Nicotine dependence, unspecified, uncomplicated: Secondary | ICD-10-CM | POA: Diagnosis present

## 2017-09-25 DIAGNOSIS — Z87891 Personal history of nicotine dependence: Secondary | ICD-10-CM

## 2017-09-25 DIAGNOSIS — N1370 Vesicoureteral-reflux, unspecified: Secondary | ICD-10-CM | POA: Diagnosis present

## 2017-09-25 DIAGNOSIS — Z79899 Other long term (current) drug therapy: Secondary | ICD-10-CM

## 2017-09-25 DIAGNOSIS — E860 Dehydration: Secondary | ICD-10-CM

## 2017-09-25 DIAGNOSIS — N10 Acute tubulo-interstitial nephritis: Secondary | ICD-10-CM

## 2017-09-25 DIAGNOSIS — N390 Urinary tract infection, site not specified: Secondary | ICD-10-CM | POA: Diagnosis present

## 2017-09-25 DIAGNOSIS — R509 Fever, unspecified: Secondary | ICD-10-CM | POA: Diagnosis present

## 2017-09-25 DIAGNOSIS — N12 Tubulo-interstitial nephritis, not specified as acute or chronic: Secondary | ICD-10-CM

## 2017-09-25 LAB — URINALYSIS, COMPLETE (UACMP) WITH MICROSCOPIC
BILIRUBIN URINE: NEGATIVE
BILIRUBIN URINE: NEGATIVE
Bacteria, UA: NONE SEEN
Glucose, UA: NEGATIVE mg/dL
Glucose, UA: NEGATIVE mg/dL
Ketones, ur: NEGATIVE mg/dL
Ketones, ur: NEGATIVE mg/dL
NITRITE: POSITIVE — AB
NITRITE: POSITIVE — AB
PH: 6 (ref 5.0–8.0)
Protein, ur: NEGATIVE mg/dL
Protein, ur: 30 mg/dL — AB
SPECIFIC GRAVITY, URINE: 1.012 (ref 1.005–1.030)
SPECIFIC GRAVITY, URINE: 1.013 (ref 1.005–1.030)
pH: 6 (ref 5.0–8.0)

## 2017-09-25 LAB — CBC WITH DIFFERENTIAL/PLATELET
BASOS ABS: 0 10*3/uL (ref 0–0.1)
Basophils Relative: 0 %
Eosinophils Absolute: 0.1 10*3/uL (ref 0–0.7)
Eosinophils Relative: 1 %
HEMATOCRIT: 37 % (ref 35.0–47.0)
HEMOGLOBIN: 12.4 g/dL (ref 12.0–16.0)
Lymphocytes Relative: 6 %
Lymphs Abs: 0.4 10*3/uL — ABNORMAL LOW (ref 1.0–3.6)
MCH: 29.1 pg (ref 26.0–34.0)
MCHC: 33.6 g/dL (ref 32.0–36.0)
MCV: 86.5 fL (ref 80.0–100.0)
Monocytes Absolute: 0.1 10*3/uL — ABNORMAL LOW (ref 0.2–0.9)
Monocytes Relative: 2 %
NEUTROS ABS: 5.2 10*3/uL (ref 1.4–6.5)
NEUTROS PCT: 91 %
Platelets: 218 10*3/uL (ref 150–440)
RBC: 4.27 MIL/uL (ref 3.80–5.20)
RDW: 12.7 % (ref 11.5–14.5)
WBC: 5.8 10*3/uL (ref 3.6–11.0)

## 2017-09-25 LAB — BASIC METABOLIC PANEL
Anion gap: 9 (ref 5–15)
BUN: 13 mg/dL (ref 6–20)
CHLORIDE: 106 mmol/L (ref 101–111)
CO2: 20 mmol/L — AB (ref 22–32)
CREATININE: 0.68 mg/dL (ref 0.44–1.00)
Calcium: 8.5 mg/dL — ABNORMAL LOW (ref 8.9–10.3)
GFR calc Af Amer: >60 mL/min (ref >60)
GFR calc non Af Amer: >60 mL/min (ref >60)
Glucose, Bld: 99 mg/dL (ref 65–99)
POTASSIUM: 4 mmol/L (ref 3.5–5.1)
Sodium: 135 mmol/L (ref 135–145)

## 2017-09-25 LAB — CBC
HEMATOCRIT: 40.6 % (ref 35.0–47.0)
Hemoglobin: 13.4 g/dL (ref 12.0–16.0)
MCH: 28.8 pg (ref 26.0–34.0)
MCHC: 32.9 g/dL (ref 32.0–36.0)
MCV: 87.4 fL (ref 80.0–100.0)
PLATELETS: 244 10*3/uL (ref 150–440)
RBC: 4.64 MIL/uL (ref 3.80–5.20)
RDW: 12.8 % (ref 11.5–14.5)
WBC: 8.6 10*3/uL (ref 3.6–11.0)

## 2017-09-25 LAB — LACTIC ACID, PLASMA
LACTIC ACID, VENOUS: 0.9 mmol/L (ref 0.5–1.9)
Lactic Acid, Venous: 1.3 mmol/L (ref 0.5–1.9)

## 2017-09-25 LAB — COMPREHENSIVE METABOLIC PANEL
ALBUMIN: 3.4 g/dL — AB (ref 3.5–5.0)
ALK PHOS: 67 U/L (ref 38–126)
ALT: 37 U/L (ref 14–54)
ANION GAP: 11 (ref 5–15)
AST: 64 U/L — ABNORMAL HIGH (ref 15–41)
BILIRUBIN TOTAL: 0.9 mg/dL (ref 0.3–1.2)
BUN: 14 mg/dL (ref 6–20)
CO2: 16 mmol/L — ABNORMAL LOW (ref 22–32)
Calcium: 8.3 mg/dL — ABNORMAL LOW (ref 8.9–10.3)
Chloride: 108 mmol/L (ref 101–111)
Creatinine, Ser: 0.79 mg/dL (ref 0.44–1.00)
GFR calc non Af Amer: >60 mL/min (ref >60)
GLUCOSE: 124 mg/dL — AB (ref 65–99)
POTASSIUM: 3.7 mmol/L (ref 3.5–5.1)
Sodium: 135 mmol/L (ref 135–145)
TOTAL PROTEIN: 6.9 g/dL (ref 6.5–8.1)

## 2017-09-25 LAB — INFLUENZA PANEL BY PCR (TYPE A & B)
INFLAPCR: NEGATIVE
INFLBPCR: NEGATIVE

## 2017-09-25 LAB — POCT PREGNANCY, URINE: Preg Test, Ur: NEGATIVE

## 2017-09-25 LAB — TROPONIN I

## 2017-09-25 MED ORDER — SODIUM CHLORIDE 0.9 % IV SOLN
1.0000 g | INTRAVENOUS | Status: DC
  Filled 2017-09-25 (×3): qty 10

## 2017-09-25 MED ORDER — ONDANSETRON HCL 4 MG/2ML IJ SOLN: 4.0000 mg | Freq: Once | INTRAMUSCULAR | Status: DC

## 2017-09-25 MED ORDER — ACETAMINOPHEN 500 MG PO TABS
1000.0000 mg | ORAL_TABLET | Freq: Once | ORAL | Status: AC
Start: 2017-09-25 — End: 2017-09-25
  Administered 2017-09-25: 1000 mg via ORAL
  Filled 2017-09-25: qty 2

## 2017-09-25 MED ORDER — ACETAMINOPHEN 325 MG PO TABS
650.0000 mg | ORAL_TABLET | Freq: Four times a day (QID) | ORAL | Status: DC | PRN
  Administered 2017-09-26 – 2017-09-27 (×2): 650 mg via ORAL
  Filled 2017-09-25 (×3): qty 2

## 2017-09-25 MED ORDER — KETOROLAC TROMETHAMINE 30 MG/ML IJ SOLN
15.0000 mg | INTRAMUSCULAR | Status: AC
  Administered 2017-09-25: 15 mg via INTRAVENOUS
  Filled 2017-09-25: qty 1

## 2017-09-25 MED ORDER — SODIUM CHLORIDE 0.9 % IV BOLUS (SEPSIS)
1000.0000 mL | Freq: Once | INTRAVENOUS | Status: AC
  Administered 2017-09-25: 1000 mL via INTRAVENOUS

## 2017-09-25 MED ORDER — SODIUM CHLORIDE 0.9 % IV SOLN
2.0000 g | Freq: Once | INTRAVENOUS | Status: AC
  Administered 2017-09-25: 2 g via INTRAVENOUS
  Filled 2017-09-25: qty 20

## 2017-09-25 MED ORDER — METOCLOPRAMIDE HCL 5 MG/ML IJ SOLN
10.0000 mg | Freq: Once | INTRAMUSCULAR | Status: AC
  Administered 2017-09-25: 10 mg via INTRAVENOUS
  Filled 2017-09-25: qty 2

## 2017-09-25 MED ORDER — SODIUM CHLORIDE 0.9 % IV SOLN
INTRAVENOUS | Status: DC
  Administered 2017-09-25 – 2017-09-27 (×4): via INTRAVENOUS

## 2017-09-25 MED ORDER — DOXYLAMINE SUCCINATE (SLEEP) 25 MG PO TABS
25.0000 mg | ORAL_TABLET | Freq: Every evening | ORAL | Status: DC | PRN
  Administered 2017-09-27 (×2): 25 mg via ORAL
  Filled 2017-09-25 (×5): qty 1

## 2017-09-25 MED ORDER — CEFPODOXIME PROXETIL 100 MG PO TABS: 100.0000 mg | ORAL_TABLET | Freq: Two times a day (BID) | ORAL | 0 refills | Status: DC

## 2017-09-25 MED ORDER — DOCUSATE SODIUM 100 MG PO CAPS: 100.0000 mg | ORAL_CAPSULE | Freq: Two times a day (BID) | ORAL | Status: DC | PRN

## 2017-09-25 MED ORDER — DEXTROSE-NACL 5-0.45 % IV SOLN
Freq: Once | INTRAVENOUS | Status: AC
  Administered 2017-09-25: 11:00:00 via INTRAVENOUS

## 2017-09-25 MED ORDER — FERROUS GLUCONATE 324 (38 FE) MG PO TABS
324.0000 mg | ORAL_TABLET | Freq: Every day | ORAL | Status: DC
  Filled 2017-09-25 (×2): qty 1

## 2017-09-25 MED ORDER — ONDANSETRON 4 MG PO TBDP: 4.0000 mg | ORAL_TABLET | Freq: Three times a day (TID) | ORAL | 0 refills | Status: DC | PRN

## 2017-09-25 MED ORDER — HEPARIN SODIUM (PORCINE) 5000 UNIT/ML IJ SOLN
5000.0000 [IU] | Freq: Three times a day (TID) | INTRAMUSCULAR | Status: DC
  Administered 2017-09-25 – 2017-09-28 (×6): 5000 [IU] via SUBCUTANEOUS
  Filled 2017-09-25 (×7): qty 1

## 2017-09-25 MED ORDER — ACETAMINOPHEN 500 MG PO TABS
1000.0000 mg | ORAL_TABLET | Freq: Four times a day (QID) | ORAL | Status: DC | PRN
  Administered 2017-09-26 – 2017-09-27 (×4): 1000 mg via ORAL
  Filled 2017-09-25 (×4): qty 2

## 2017-09-25 NOTE — ED Provider Notes (Signed)
[facility] Emergency Department Provider Note  ____________________________________________  Time seen: Approximately 10:08 AM  I have reviewed the triage vital signs and the nursing notes.   HISTORY  Chief Complaint Generalized Body Aches and Urinary Frequency    HPI Christine Abbott is a 42 y.o. female who complains of left flank pain and dysuria and generalized body aches since yesterday about 10 PM. Associated with nausea and vomiting. She does have sick contacts at home. She also has a history of frequent UTIs and pyelonephritis. Denies chest pain shortness of breath or cough. No rhinorrhea. No aggravating or alleviating factors. Rates it as moderate to severe pain, achy. Lightheadedness with standing.     Past Medical History:  Diagnosis Date  . Advanced maternal age in multigravida 2017  . Anemia   . Anxiety   . UTI (urinary tract infection)      Patient Active Problem List   Diagnosis Date Noted  . Cesarean delivery delivered 05/24/2016  . Post-operative state 05/24/2016     Past Surgical History:  Procedure Laterality Date  . ABDOMINAL HERNIA REPAIR    . BREAST ENHANCEMENT SURGERY    . CESAREAN SECTION    . CESAREAN SECTION WITH BILATERAL TUBAL LIGATION Left 05/24/2016   Procedure: CESAREAN SECTION WITH UNILATERAL TUBAL LIGATION;  Surgeon: Boykin Nearing, MD;  Location: [facility] ORS;  Service: Obstetrics;  Laterality: Left;  . SALPINGECTOMY       Prior to Admission medications   Medication Sig Start Date End Date Taking? Authorizing Provider  acetaminophen (TYLENOL) 500 MG tablet Take 1,000 mg by mouth every 6 (six) hours as needed for moderate pain or headache.    [provider]  cefpodoxime (VANTIN) 100 MG tablet Take 1 tablet (100 mg total) by mouth 2 (two) times daily. 09/25/17   Carrie Mew, MD  doxylamine, Sleep, (UNISOM) 25 MG tablet Take 25 mg by mouth at bedtime as needed for sleep.    [provider]  Ferrous Gluconate 256 (28 Fe) MG TABS Take 256 mg by mouth daily.    [provider]  HYDROcodone-acetaminophen (NORCO/VICODIN) 5-325 MG tablet Take 1-2 tablets by mouth every 4 (four) hours as needed. Patient not taking: Reported on 09/25/2017 05/27/16   Ward, Honor Loh, MD  ondansetron (ZOFRAN ODT) 4 MG disintegrating tablet Take 1 tablet (4 mg total) by mouth every 8 (eight) hours as needed for nausea or vomiting. 09/25/17   Carrie Mew, MD  promethazine (PHENERGAN) 12.5 MG tablet Take 1 tablet (12.5 mg total) by mouth every 6 (six) hours as needed for nausea or vomiting. Patient not taking: Reported on 09/25/2017 02/18/17   Merlyn Lot, MD     Allergies Ciprofloxacin; Macrobid [nitrofurantoin]; and Sulfa antibiotics   No family history on file.  Social History Social History   Tobacco Use  . Smoking status: Former Research scientist (life sciences)  . Smokeless tobacco: Never Used  Substance Use Topics  . Alcohol use: No  . Drug use: No    Review of Systems  Constitutional:   Positive fever and chills.  ENT:   No sore throat. No rhinorrhea. Cardiovascular:   No chest pain or syncope. Respiratory:   No dyspnea or cough. Gastrointestinal:   Positive left flank pain and vomiting as above. No diarrhea or constipation  Musculoskeletal:   Negative for focal pain or swelling. Positive generalized body aches All other systems reviewed and are negative except as documented above in ROS and HPI.  ____________________________________________   PHYSICAL EXAM:  VITAL SIGNS: ED Triage Vitals  Enc Vitals Group     BP 09/25/17 0857 (!) 143/89     Pulse Rate 09/25/17 0857 (!) 135     Resp 09/25/17 0857 18     Temp 09/25/17 0857 (!) 102.7 F (39.3 C)     Temp Source 09/25/17 0857 Oral     SpO2 09/25/17 0857 96 %     Weight 09/25/17 0901 135 lb (61.2 kg)     Height 09/25/17 0901 5\' 2"  (1.575 m)     Head Circumference --      Peak Flow --      Pain Score 09/25/17 0900 8     Pain Loc --       Pain Edu? --      Excl. in [place]? --     Vital signs reviewed, nursing assessments reviewed.   Constitutional:   Alert and oriented. Well appearing and in no distress. Eyes:   No scleral icterus.  EOMI. No nystagmus. No conjunctival pallor. PERRL. ENT   Head:   Normocephalic and atraumatic.   Nose:   No congestion/rhinnorhea.    Mouth/Throat:   Dry mucous membranes, no pharyngeal erythema. No peritonsillar mass.    Neck:   No meningismus. Full ROM. Hematological/Lymphatic/Immunilogical:   No cervical lymphadenopathy. Cardiovascular:   Tachycardia heart rate 120. Symmetric bilateral radial and DP pulses.  No murmurs.  Respiratory:   Normal respiratory effort without tachypnea/retractions. Breath sounds are clear and equal bilaterally. No wheezes/rales/rhonchi. Gastrointestinal:   Soft with suprapubic tenderness. Non distended. There is mild left CVA tenderness.  No rebound, rigidity, or guarding. Genitourinary:   deferred Musculoskeletal:   Normal range of motion in all extremities. No joint effusions.  No lower extremity tenderness.  No edema. Neurologic:   Normal speech and language.  Motor grossly intact. No acute focal neurologic deficits are appreciated.  Skin:    Skin is warm, dry and intact. No rash noted.  No petechiae, purpura, or bullae.  ____________________________________________    LABS (pertinent positives/negatives) (all labs ordered are listed, but only abnormal results are displayed) Labs Reviewed  URINALYSIS, COMPLETE (UACMP) WITH MICROSCOPIC - Abnormal; Notable for the following components:      Result Value   Color, Urine YELLOW (*)    APPearance HAZY (*)    Hgb urine dipstick MODERATE (*)    Nitrite POSITIVE (*)    Leukocytes, UA TRACE (*)    Bacteria, UA RARE (*)    Squamous Epithelial / LPF 6-30 (*)    All other components within normal limits  BASIC METABOLIC PANEL - Abnormal; Notable for the following components:   CO2 20 (*)     Calcium 8.5 (*)    All other components within normal limits  URINE CULTURE  CBC  INFLUENZA PANEL BY PCR (TYPE A & B)  POC URINE PREG, ED  POCT PREGNANCY, URINE   ____________________________________________   EKG    ____________________________________________    RADIOLOGY  No results found.  ____________________________________________   PROCEDURES Procedures  ____________________________________________    CLINICAL IMPRESSION / ASSESSMENT AND PLAN / ED COURSE  Pertinent labs & imaging results that were available during my care of the patient were reviewed by me and considered in my medical decision making (see chart for details).     Clinical Course as of Sep 25 1213  Thu Sep 25, 2017  9528 Fever, tachycardia. ILI vs UTI/pyelo. Not septic at this time. F/u labs. Flu pcr, ua.   [PS]  1010 Labs reveal UTI c/w early pyelonephritis. Patient is young and healthy, has been previously about a by urology, suitable for outpatient follow-up if symptoms are controllable and she can tolerate oral intake. Flu negative, CBC and chemistry panel unremarkable without evidence of acute renal failure or electrolyte abnormality. I'll give IV ceftriaxone and plan to discharge on cephalosporins as that has worked well for her in the past.  [PS]  1012 Most recent urine culture in electronic medical record December 2018 shows pansensitive Escherichia coli.  [PS]  1030 Patient feeling better but still has a headache. I don't think she has meningitis or intracranial hemorrhage or other acute intracranial or CNS process. I think this is a symptom of her overall illness and dehydration and inflammatory response. Continue IV fluids. By mouth trial. Reglan.  [PS]  3888 Vital signs normalized. Rest the workup unremarkable, flu negative. Discharge with cephalosporins, Zofran, follow up with primary care. Return precautions.  [PS]    Clinical Course User Index [PS] Carrie Mew, MD      ____________________________________________   FINAL CLINICAL IMPRESSION(S) / ED DIAGNOSES    Final diagnoses:  Pyelonephritis  Dehydration     ED Discharge Orders        Ordered    cefpodoxime (VANTIN) 100 MG tablet  2 times daily     09/25/17 1215    ondansetron (ZOFRAN ODT) 4 MG disintegrating tablet  Every 8 hours PRN     09/25/17 1215      Portions of this note were generated with dragon dictation software. Dictation errors may occur despite best attempts at proofreading.    Carrie Mew, MD 09/25/17 1215

## 2017-09-25 NOTE — ED Notes (Signed)
Pt aware of need for urine specimen. 

## 2017-09-25 NOTE — ED Triage Notes (Signed)
Pt to ED via POV c/o fever. Pt seen in our ED this morning and diagnosed with pyelonephritis. Pt states that she is having body aches and chills and that her back is hurting very bad. Pt in NAD at this time. Pt is febrile in triage 103.1. Pt reports that she took Ibuprofen 400 mg at home about 1 hour prior to coming to the ED.

## 2017-09-25 NOTE — ED Provider Notes (Signed)
[facility] Emergency Department Provider Note  Time seen: 6:36 PM  I have reviewed the triage vital signs and the nursing notes.   HISTORY  Chief Complaint Fever    HPI Christine Abbott is a 42 y.o. female with a past medical history of anxiety, anemia, presents to the emergency department for fever dysuria nausea vomiting.  According to the patient yesterday she developed mild dysuria, states today became significantly worse along with a foul odor to her urine and lower abdominal discomfort.  Patient had nausea with one episode of vomiting this morning.  Patient was seen in the emergency department this morning, discharged on antibiotics to cover for urinary tract infection.  Patient states the fever is gotten worse as high as 104 at home, now having left back pain as well as lower abdominal pain.  Patient denies any diarrhea.  Denies any cough or congestion.  Denies chest pain.   Past Medical History:  Diagnosis Date  . Advanced maternal age in multigravida 2017  . Anemia   . Anxiety   . UTI (urinary tract infection)     Patient Active Problem List   Diagnosis Date Noted  . Cesarean delivery delivered 05/24/2016  . Post-operative state 05/24/2016    Past Surgical History:  Procedure Laterality Date  . ABDOMINAL HERNIA REPAIR    . BREAST ENHANCEMENT SURGERY    . CESAREAN SECTION    . CESAREAN SECTION WITH BILATERAL TUBAL LIGATION Left 05/24/2016   Procedure: CESAREAN SECTION WITH UNILATERAL TUBAL LIGATION;  Surgeon: Boykin Nearing, MD;  Location: [facility] ORS;  Service: Obstetrics;  Laterality: Left;  . SALPINGECTOMY      Prior to Admission medications   Medication Sig Start Date End Date Taking? Authorizing Provider  acetaminophen (TYLENOL) 500 MG tablet Take 1,000 mg by mouth every 6 (six) hours as needed for moderate pain or headache.    [provider]  cefpodoxime (VANTIN) 100 MG tablet Take 1 tablet (100 mg total) by mouth 2 (two)  times daily. 09/25/17   Carrie Mew, MD  doxylamine, Sleep, (UNISOM) 25 MG tablet Take 25 mg by mouth at bedtime as needed for sleep.    [provider]  Ferrous Gluconate 256 (28 Fe) MG TABS Take 256 mg by mouth daily.    [provider]  HYDROcodone-acetaminophen (NORCO/VICODIN) 5-325 MG tablet Take 1-2 tablets by mouth every 4 (four) hours as needed. Patient not taking: Reported on 09/25/2017 05/27/16   Ward, Honor Loh, MD  ondansetron (ZOFRAN ODT) 4 MG disintegrating tablet Take 1 tablet (4 mg total) by mouth every 8 (eight) hours as needed for nausea or vomiting. 09/25/17   Carrie Mew, MD  promethazine (PHENERGAN) 12.5 MG tablet Take 1 tablet (12.5 mg total) by mouth every 6 (six) hours as needed for nausea or vomiting. Patient not taking: Reported on 09/25/2017 02/18/17   Merlyn Lot, MD    Allergies  Allergen Reactions  . Ciprofloxacin Hives  . Macrobid [Nitrofurantoin] Hives  . Sulfa Antibiotics Hives and Other (See Comments)    No family history on file.  Social History Social History   Tobacco Use  . Smoking status: Former Research scientist (life sciences)  . Smokeless tobacco: Never Used  Substance Use Topics  . Alcohol use: No  . Drug use: No    Review of Systems Constitutional: Fevers high as 104 at home. Eyes: Negative for visual complaints ENT: Negative for recent illness/congestion Cardiovascular: Negative for chest pain. Respiratory: Negative for shortness of breath. Gastrointestinal: Positive  for lower abdominal pain, left back pain.  Positive for nausea vomiting x1 this morning.  Negative for diarrhea Genitourinary: Negative for dysuria/burning as well as foul odor to her urine. Musculoskeletal: Does state body aches especially the abdomen and back. Skin: Negative for skin complaints  Neurological: Negative for headache All other ROS negative  ____________________________________________   PHYSICAL EXAM:  VITAL SIGNS: ED Triage Vitals  Enc Vitals  Group     BP 09/25/17 1813 (!) 159/93     Pulse Rate 09/25/17 1813 (!) 132     Resp 09/25/17 1813 18     Temp 09/25/17 1813 (!) 103.1 F (39.5 C)     Temp Source 09/25/17 1813 Oral     SpO2 09/25/17 1813 95 %     Weight 09/25/17 1818 135 lb (61.2 kg)     Height --      Head Circumference --      Peak Flow --      Pain Score 09/25/17 1818 8     Pain Loc --      Pain Edu? --      Excl. in [place]? --    Constitutional: Alert and oriented. Well appearing and in no distress. Eyes: Normal exam ENT   Head: Normocephalic and atraumatic.   Nose: No congestion/rhinnorhea.   Mouth/Throat: Mucous membranes are moist. Cardiovascular: Regular rhythm, rate around 120.  No obvious murmur. Respiratory: Normal respiratory effort without tachypnea nor retractions. Breath sounds are clear Gastrointestinal: Soft, mild lower abdominal tenderness to palpation.  No rebound or guarding.  No distention.  Moderate left CVA tenderness. Musculoskeletal: Nontender with normal range of motion in all extremities.  Neurologic:  Normal speech and language. No gross focal neurologic deficits  Skin:  Skin is warm, dry and intact.  Psychiatric: Mood and affect are normal.   ____________________________________________    EKG  EKG reviewed and interpreted by myself shows sinus tachycardia 105 bpm with a narrow QRS, normal axis, normal intervals, nonspecific ST changes.  ____________________________________________    RADIOLOGY  Chest x-ray negative  ____________________________________________   INITIAL IMPRESSION / ASSESSMENT AND PLAN / ED COURSE  Pertinent labs & imaging results that were available during my care of the patient were reviewed by me and considered in my medical decision making (see chart for details).  Patient presents to the emergency department for lower abdominal pain urinary frequency dysuria foul odor to her urine, fever to 104.  Differential would include urinary tract  infection, pyelonephritis, sepsis, other intra-abdominal pathology.  Patient's labs from earlier today showed nitrite positive urine, history is consistent with urinary tract infection.  Patient is febrile and tachycardic upon arrival.  We will activate sepsis protocols, start broad-spectrum IV antibiotics with Rocephin, send cultures begin IV hydration.  Anticipate admission to the hospital.  I discussed this with the patient who is agreeable to this plan.  Workup from earlier today was negative for influenza.  We will also check a chest x-ray as a precaution.   Blood work largely within normal limits with a normal lactate, normal white blood cell count.  However given the patient's fever to 104 earlier, 103.1 currently tachycardia nausea vomiting abdominal discomfort meeting sepsis criteria we will admit to the hospitalist service for continued IV antibiotics until the cultures have grown out.   CRITICAL CARE Performed by: Harvest Dark   Total critical care time: 30 minutes  Critical care time was exclusive of separately billable procedures and treating other patients.  Critical care was necessary to  treat or prevent imminent or life-threatening deterioration.  Critical care was time spent personally by me on the following activities: development of treatment plan with patient and/or surrogate as well as nursing, discussions with consultants, evaluation of patient's response to treatment, examination of patient, obtaining history from patient or surrogate, ordering and performing treatments and interventions, ordering and review of laboratory studies, ordering and review of radiographic studies, pulse oximetry and re-evaluation of patient's condition.  ____________________________________________   FINAL CLINICAL IMPRESSION(S) / ED DIAGNOSES  Sepsis Urinary tract infection Fever    Harvest Dark, MD 09/25/17 1925

## 2017-09-25 NOTE — ED Triage Notes (Signed)
Pt c/o bodyaches with painful urination and N/V since yesterday.

## 2017-09-25 NOTE — H&P (Signed)
[facility] [place] NAME: Christine Abbott    MR#:  [id number]  DATE OF BIRTH:  [birth year]  DATE OF ADMISSION:  09/25/2017  PRIMARY CARE PHYSICIAN: Idelle Crouch, MD   REQUESTING/REFERRING PHYSICIAN: [email or web address]  CHIEF COMPLAINT:   Chief Complaint  Patient presents with  . Fever    HISTORY OF PRESENT ILLNESS: Christine Abbott  is a 42 y.o. female with a known history of Anxiety, recurrent UTI- has been having UTIs almost every month because of her  urethral reflux, she has been following with urology Dr. Jacqlyn Larsen- and before her last pregnancy she was on daily prophylactic antibiotic at home but since her last pregnancy and delivering her child 16 months ago, never restarted on that. She again had symptoms of UTI since last night, she came to emergency room today morning with fever and having some nausea and noted to have UTI, she was sent home with oral antibiotics. But she started to have high-grade fever again with pain in her both side flank and feeling uneasy. In ER she came back and noted to have tachycardia, high-grade feverand blood pressure was borderline, concerned with this ER physician suggested to admit for IV antibiotics.  PAST MEDICAL HISTORY:   Past Medical History:  Diagnosis Date  . Advanced maternal age in multigravida 2017  . Anemia   . Anxiety   . UTI (urinary tract infection)     PAST SURGICAL HISTORY:  Past Surgical History:  Procedure Laterality Date  . ABDOMINAL HERNIA REPAIR    . BREAST ENHANCEMENT SURGERY    . CESAREAN SECTION    . CESAREAN SECTION WITH BILATERAL TUBAL LIGATION Left 05/24/2016   Procedure: CESAREAN SECTION WITH UNILATERAL TUBAL LIGATION;  Surgeon: Boykin Nearing, MD;  Location: [facility] ORS;  Service: Obstetrics;  Laterality: Left;  . SALPINGECTOMY      SOCIAL HISTORY:  Social History   Tobacco Use  . Smoking status: Current Every Day Smoker  . Smokeless tobacco: Never Used  Substance Use  Topics  . Alcohol use: No    FAMILY HISTORY:  Family History  Problem Relation Age of Onset  . Hypertension Father     DRUG ALLERGIES:  Allergies  Allergen Reactions  . Ciprofloxacin Hives  . Macrobid [Nitrofurantoin] Hives  . Sulfa Antibiotics Hives and Other (See Comments)    REVIEW OF SYSTEMS:   CONSTITUTIONAL: positive for fever,no fatigue or weakness.  EYES: No blurred or double vision.  EARS, NOSE, AND THROAT: No tinnitus or ear pain.  RESPIRATORY: No cough, shortness of breath, wheezing or hemoptysis.  CARDIOVASCULAR: No chest pain, orthopnea, edema.  GASTROINTESTINAL: No nausea, vomiting, diarrhea or abdominal pain.  GENITOURINARY: she have dysuria,no hematuria.  ENDOCRINE: No polyuria, nocturia,  HEMATOLOGY: No anemia, easy bruising or bleeding SKIN: No rash or lesion. MUSCULOSKELETAL: No joint pain or arthritis.   NEUROLOGIC: No tingling, numbness, weakness.  PSYCHIATRY: No anxiety or depression.   MEDICATIONS AT HOME:  Prior to Admission medications   Medication Sig Start Date End Date Taking? Authorizing Provider  acetaminophen (TYLENOL) 500 MG tablet Take 1,000 mg by mouth every 6 (six) hours as needed for moderate pain or headache.    [provider]  cefpodoxime (VANTIN) 100 MG tablet Take 1 tablet (100 mg total) by mouth 2 (two) times daily. 09/25/17   Carrie Mew, MD  doxylamine, Sleep, (UNISOM) 25 MG tablet Take 25 mg by mouth at bedtime as needed for sleep.    [provider]  Ferrous Gluconate 256 (28 Fe) MG TABS Take 256 mg by mouth daily.    [provider]  HYDROcodone-acetaminophen (NORCO/VICODIN) 5-325 MG tablet Take 1-2 tablets by mouth every 4 (four) hours as needed. Patient not taking: Reported on 09/25/2017 05/27/16   Ward, Honor Loh, MD  ondansetron (ZOFRAN ODT) 4 MG disintegrating tablet Take 1 tablet (4 mg total) by mouth every 8 (eight) hours as needed for nausea or vomiting. 09/25/17   Carrie Mew, MD   promethazine (PHENERGAN) 12.5 MG tablet Take 1 tablet (12.5 mg total) by mouth every 6 (six) hours as needed for nausea or vomiting. Patient not taking: Reported on 09/25/2017 02/18/17   Merlyn Lot, MD      PHYSICAL EXAMINATION:   VITAL SIGNS: Blood pressure 114/67, pulse 98, temperature (!) 103.1 F (39.5 C), temperature source Oral, resp. rate 18, weight 61.2 kg (135 lb), last menstrual period 08/31/2017, SpO2 96 %, unknown if currently breastfeeding.  GENERAL:  42 y.o.-year-old patient lying in the bed with no acute distress.  EYES: Pupils equal, round, reactive to light and accommodation. No scleral icterus. Extraocular muscles intact.  HEENT: Head atraumatic, normocephalic. Oropharynx and nasopharynx clear.  NECK:  Supple, no jugular venous distention. No thyroid enlargement, no tenderness.  LUNGS: Normal breath sounds bilaterally, no wheezing, rales,rhonchi or crepitation. No use of accessory muscles of respiration.  CARDIOVASCULAR: S1, S2 normal. No murmurs, rubs, or gallops.  ABDOMEN: Soft, nontender, nondistended. Bowel sounds present. No organomegaly or mass.  EXTREMITIES: No pedal edema, cyanosis, or clubbing.  NEUROLOGIC: Cranial nerves II through XII are intact. Muscle strength 5/5 in all extremities. Sensation intact. Gait not checked.  PSYCHIATRIC: The patient is alert and oriented x 3.  SKIN: No obvious rash, lesion, or ulcer.   LABORATORY PANEL:   CBC Recent Labs  Lab 09/25/17 0904 09/25/17 1843  WBC 8.6 5.8  HGB 13.4 12.4  HCT 40.6 37.0  PLT 244 218  MCV 87.4 86.5  MCH 28.8 29.1  MCHC 32.9 33.6  RDW 12.8 12.7  LYMPHSABS  --  0.4*  MONOABS  --  0.1*  EOSABS  --  0.1  BASOSABS  --  0.0   ------------------------------------------------------------------------------------------------------------------  Chemistries  Recent Labs  Lab 09/25/17 0904 09/25/17 1843  NA 135 135  K 4.0 3.7  CL 106 108  CO2 20* 16*  GLUCOSE 99 124*  BUN 13 14   CREATININE 0.68 0.79  CALCIUM 8.5* 8.3*  AST  --  64*  ALT  --  37  ALKPHOS  --  67  BILITOT  --  0.9   ------------------------------------------------------------------------------------------------------------------ estimated creatinine clearance is 80.4 mL/min (by C-G formula based on SCr of 0.79 mg/dL). ------------------------------------------------------------------------------------------------------------------ No results for input(s): TSH, T4TOTAL, T3FREE, THYROIDAB in the last 72 hours.  Invalid input(s): FREET3   Coagulation profile No results for input(s): INR, PROTIME in the last 168 hours. ------------------------------------------------------------------------------------------------------------------- No results for input(s): DDIMER in the last 72 hours. -------------------------------------------------------------------------------------------------------------------  Cardiac Enzymes Recent Labs  Lab 09/25/17 1843  TROPONINI <0.03   ------------------------------------------------------------------------------------------------------------------ Invalid input(s): POCBNP  ---------------------------------------------------------------------------------------------------------------  Urinalysis    Component Value Date/Time   COLORURINE YELLOW (A) 09/25/2017 1844   APPEARANCEUR HAZY (A) 09/25/2017 1844   APPEARANCEUR Cloudy 06/20/2012 2006   LABSPEC 1.013 09/25/2017 1844   LABSPEC 1.020 06/20/2012 2006   PHURINE 6.0 09/25/2017 1844   GLUCOSEU NEGATIVE 09/25/2017 1844   GLUCOSEU Negative 06/20/2012 2006   HGBUR MODERATE (A) 09/25/2017 [place] NEGATIVE 09/25/2017 1844  BILIRUBINUR Negative 06/20/2012 2006   KETONESUR NEGATIVE 09/25/2017 1844   PROTEINUR 30 (A) 09/25/2017 1844   NITRITE POSITIVE (A) 09/25/2017 1844   LEUKOCYTESUR TRACE (A) 09/25/2017 1844   LEUKOCYTESUR Trace 06/20/2012 2006     RADIOLOGY: Dg Chest Port 1  View  Result Date: 09/25/2017 CLINICAL DATA:  Sepsis.  Pyelonephritis.  Myalgias and fever. EXAM: PORTABLE CHEST 1 VIEW COMPARISON:  06/20/2012. FINDINGS: Cardiac silhouette and mediastinal contours normal in appearance for the AP portable technique. Pulmonary parenchyma clear. Bronchovascular markings normal. Pulmonary vascularity normal. No pneumothorax. No visible pleural effusions. IMPRESSION: No acute cardiopulmonary disease. Electronically Signed   By: Evangeline Dakin M.D.   On: 09/25/2017 19:07    EKG: Orders placed or performed during the hospital encounter of 09/25/17  . ED EKG 12-Lead  . ED EKG 12-Lead  . EKG 12-Lead  . EKG 12-Lead    IMPRESSION AND PLAN:  * sepsis   Due to UTI.   IV ceftriaxone, follow blood and urine culture.   IV fluids.  * recurrent UTI with reflux   She may be a candidate for long-term suppressive antibiotic therapy  * active smoking   Counseled to quit smoking for 4 minutes and offered nicotine patch  All the records are reviewed and case discussed with ED provider. Management plans discussed with the patient, family and they are in agreement.  CODE STATUS: full. Code Status History    Date Active Date Inactive Code Status Order ID Comments User Context   05/24/2016 11:25 05/27/2016 22:17 Full Code [id number]  Schermerhorn, Gwen Her, MD Inpatient     Her husband was present in the room during my visit.  TOTAL TIME TAKING CARE OF THIS PATIENT: 35 minutes.    Vaughan Basta M.D on 09/25/2017   Between 7am to 6pm - Pager - [phone]  After 6pm go to [email or web address] - password [email or web address] [facility]  Office  [phone]  CC: Primary care physician; Idelle Crouch, MD   Note: This dictation was prepared with Dragon dictation along with smaller phrase technology. Any transcriptional errors that result from this process are unintentional.

## 2017-09-25 NOTE — Telephone Encounter (Signed)
Requested by Dr.Stafford to call RX into [facility] [phone] , Cefdinir 300mg  PO BID x 7 days , Qty 14

## 2017-09-25 NOTE — Progress Notes (Signed)
CODE SEPSIS - PHARMACY COMMUNICATION  **Broad Spectrum Antibiotics should be administered within 1 hour of Sepsis diagnosis**  Time Code Sepsis Called/Page Received: 3/7 @ 1840  Antibiotics Ordered: Ceftriaxone 2g IV   Time of 1st antibiotic administration: 3/7 @ 1902  Additional action taken by pharmacy: N/A  If necessary, Name of Provider/Nurse Contacted: N/A  Pernell Dupre, PharmD, BCPS Clinical Pharmacist 09/25/2017 7:09 PM

## 2017-09-25 NOTE — ED Notes (Signed)
Given sandwich tray

## 2017-09-25 NOTE — Consult Note (Signed)
Pharmacy Antibiotic Note  Christine Abbott is a 42 y.o. female admitted on 09/25/2017 with sepsis secondary to UTI.  Pharmacy has been consulted for ceftriaxone dosing. Patient received ceftriaxone 2g IV x 1 dose in ED.   Plan: Start ceftriaxone 1g IV every 24 hours.   Weight: 135 lb (61.2 kg)  Temp (24hrs), Avg:101.4 F (38.6 C), Min:98.8 F (37.1 C), Max:103.1 F (39.5 C)  Recent Labs  Lab 09/25/17 0904 09/25/17 1843  WBC 8.6 5.8  CREATININE 0.68  --     Estimated Creatinine Clearance: 80.4 mL/min (by C-G formula based on SCr of 0.68 mg/dL).    Allergies  Allergen Reactions  . Ciprofloxacin Hives  . Macrobid [Nitrofurantoin] Hives  . Sulfa Antibiotics Hives and Other (See Comments)    Antimicrobials this admission: 3/7 ceftriaxone  >>   Dose adjustments this admission:  Microbiology results: 3/7 BCx: pending 3/7 UCx: pending  Thank you for allowing pharmacy to be a part of this patient's care.  Pernell Dupre, PharmD, BCPS Clinical Pharmacist 09/25/2017 7:13 PM

## 2017-09-26 LAB — BLOOD CULTURE ID PANEL (REFLEXED)

## 2017-09-26 LAB — BASIC METABOLIC PANEL
Anion gap: 5 (ref 5–15)
BUN: 9 mg/dL (ref 6–20)
CO2: 20 mmol/L — ABNORMAL LOW (ref 22–32)
Calcium: 7.6 mg/dL — ABNORMAL LOW (ref 8.9–10.3)
Chloride: 112 mmol/L — ABNORMAL HIGH (ref 101–111)
Creatinine, Ser: 0.61 mg/dL (ref 0.44–1.00)
GFR calc Af Amer: >60 mL/min (ref >60)
GFR calc non Af Amer: >60 mL/min (ref >60)
Glucose, Bld: 99 mg/dL (ref 65–99)
Potassium: 3.7 mmol/L (ref 3.5–5.1)
Sodium: 137 mmol/L (ref 135–145)

## 2017-09-26 LAB — CBC
HCT: 35.1 % (ref 35.0–47.0)
Hemoglobin: 11.6 g/dL — ABNORMAL LOW (ref 12.0–16.0)
MCH: 29.2 pg (ref 26.0–34.0)
MCHC: 33 g/dL (ref 32.0–36.0)
MCV: 88.3 fL (ref 80.0–100.0)
Platelets: 186 10*3/uL (ref 150–440)
RBC: 3.98 MIL/uL (ref 3.80–5.20)
RDW: 13 % (ref 11.5–14.5)
WBC: 9 10*3/uL (ref 3.6–11.0)

## 2017-09-26 MED ORDER — IBUPROFEN 400 MG PO TABS
600.0000 mg | ORAL_TABLET | Freq: Once | ORAL | Status: AC
  Administered 2017-09-26: 600 mg via ORAL
  Filled 2017-09-26: qty 2

## 2017-09-26 MED ORDER — SODIUM CHLORIDE 0.9 % IV SOLN
2.0000 g | INTRAVENOUS | Status: DC
  Administered 2017-09-26 – 2017-09-27 (×2): 2 g via INTRAVENOUS
  Filled 2017-09-26 (×2): qty 20

## 2017-09-26 NOTE — Progress Notes (Signed)
[facility] at [place] NAME: Genise Strack    MR#:  [id number]  DATE OF BIRTH:  Apr 07, 1976  SUBJECTIVE:  Came in with high grade fever and found to have UTI. C/o flank pain No vomiting  REVIEW OF SYSTEMS:   Review of Systems  Constitutional: Positive for chills and fever. Negative for weight loss.  HENT: Negative for ear discharge, ear pain and nosebleeds.   Eyes: Negative for blurred vision, pain and discharge.  Respiratory: Negative for sputum production, shortness of breath, wheezing and stridor.   Cardiovascular: Negative for chest pain, palpitations, orthopnea and PND.  Gastrointestinal: Negative for abdominal pain, diarrhea, nausea and vomiting.  Genitourinary: Positive for dysuria, flank pain and frequency. Negative for urgency.  Musculoskeletal: Negative for back pain and joint pain.  Neurological: Negative for sensory change, speech change, focal weakness and weakness.  Psychiatric/Behavioral: Negative for depression and hallucinations. The patient is not nervous/anxious.    Tolerating Diet:yes Tolerating PT: ambulatory  DRUG ALLERGIES:   Allergies  Allergen Reactions  . Ciprofloxacin Hives  . Macrobid [Nitrofurantoin] Hives  . Sulfa Antibiotics Hives and Other (See Comments)    VITALS:  Blood pressure 126/77, pulse 87, temperature 99 F (37.2 C), temperature source Oral, resp. rate 20, height 5\' 2"  (1.575 m), weight 71.5 kg (157 lb 9.6 oz), last menstrual period 08/31/2017, SpO2 98 %, unknown if currently breastfeeding.  PHYSICAL EXAMINATION:   Physical Exam  GENERAL:  42 y.o.-year-old patient lying in the bed with no acute distress.  EYES: Pupils equal, round, reactive to light and accommodation. No scleral icterus. Extraocular muscles intact.  HEENT: Head atraumatic, normocephalic. Oropharynx and nasopharynx clear.  NECK:  Supple, no jugular venous distention. No thyroid enlargement, no tenderness.  LUNGS:  Normal breath sounds bilaterally, no wheezing, rales, rhonchi. No use of accessory muscles of respiration.  CARDIOVASCULAR: S1, S2 normal. No murmurs, rubs, or gallops.  ABDOMEN: Soft, nontender, nondistended. Bowel sounds present. No organomegaly or mass.  EXTREMITIES: No cyanosis, clubbing or edema b/l.    NEUROLOGIC: Cranial nerves II through XII are intact. No focal Motor or sensory deficits b/l.   PSYCHIATRIC:  patient is alert and oriented x 3.  SKIN: No obvious rash, lesion, or ulcer.   LABORATORY PANEL:  CBC Recent Labs  Lab 09/26/17 0506  WBC 9.0  HGB 11.6*  HCT 35.1  PLT 186    Chemistries  Recent Labs  Lab 09/25/17 1843 09/26/17 0506  NA 135 137  K 3.7 3.7  CL 108 112*  CO2 16* 20*  GLUCOSE 124* 99  BUN 14 9  CREATININE 0.79 0.61  CALCIUM 8.3* 7.6*  AST 64*  --   ALT 37  --   ALKPHOS 67  --   BILITOT 0.9  --    Cardiac Enzymes Recent Labs  Lab 09/25/17 1843  TROPONINI <0.03   RADIOLOGY:  Dg Chest Port 1 View  Result Date: 09/25/2017 CLINICAL DATA:  Sepsis.  Pyelonephritis.  Myalgias and fever. EXAM: PORTABLE CHEST 1 VIEW COMPARISON:  06/20/2012. FINDINGS: Cardiac silhouette and mediastinal contours normal in appearance for the AP portable technique. Pulmonary parenchyma clear. Bronchovascular markings normal. Pulmonary vascularity normal. No pneumothorax. No visible pleural effusions. IMPRESSION: No acute cardiopulmonary disease. Electronically Signed   By: Evangeline Dakin M.D.   On: 09/25/2017 19:07   ASSESSMENT AND PLAN:  Sarann Tregre  is a 42 y.o. female with a known history of Anxiety, recurrent UTI- has been having UTIs almost every  month because of her  urethral reflux.she came to emergency room today morning with fever and having some nausea and noted to have UTI  * sepsis   Due to UTI.   IV ceftriaxone, follow blood and urine culture.   IV fluids. BC 1/4 positive for E coli  * recurrent UTI with reflux   She may be a candidate for  long-term suppressive antibiotic therapy. Pt advised to also f/u with dr Jacqlyn Larsen  * active smoking   Counseled to quit smoking for 4 minutes and offered nicotine patch  Case discussed with Care Management/Social Worker. Management plans discussed with the patient, family and they are in agreement.  CODE STATUS: full DVT Prophylaxis: lovenox  TOTAL TIME TAKING CARE OF THIS PATIENT: *30* minutes.  >50% time spent on counselling and coordination of care  POSSIBLE D/C IN *1-2 DAYS, DEPENDING ON CLINICAL CONDITION.  Note: This dictation was prepared with Dragon dictation along with smaller phrase technology. Any transcriptional errors that result from this process are unintentional.  Fritzi Mandes M.D on 09/26/2017 at 2:37 PM  Between 7am to 6pm - Pager - [phone]  After 6pm go to [email or web address] - password [email or web address] [facility]  Office  [phone]  CC: Primary care physician; Idelle Crouch, MDPatient ID: Benay Pillow, female   DOB: [birth year], 42 y.o.   MRN: [id number]

## 2017-09-26 NOTE — Progress Notes (Signed)
PHARMACY - PHYSICIAN COMMUNICATION CRITICAL VALUE ALERT - BLOOD CULTURE IDENTIFICATION (BCID)  Christine Abbott is an 42 y.o. female who presented to [facility] on 09/25/2017 with a chief complaint of uti  Assessment:  bacteremia (include suspected source if known)  Name of physician (or Provider) Contacted: Dr Posey Pronto  Current antibiotics: ceftriaxone 1gm iv q24h changed to ceftriaxone 2gm iv q24h    Changes to prescribed antibiotics recommended:  Recommendations accepted by provider  Results for orders placed or performed during the hospital encounter of 09/25/17  Blood Culture ID Panel (Reflexed) (Collected: 09/25/2017  6:43 PM)  Result Value Ref Range   Enterococcus species NOT DETECTED NOT DETECTED   Listeria monocytogenes NOT DETECTED NOT DETECTED   Staphylococcus species NOT DETECTED NOT DETECTED   Staphylococcus aureus NOT DETECTED NOT DETECTED   Streptococcus species NOT DETECTED NOT DETECTED   Streptococcus agalactiae NOT DETECTED NOT DETECTED   Streptococcus pneumoniae NOT DETECTED NOT DETECTED   Streptococcus pyogenes NOT DETECTED NOT DETECTED   Acinetobacter baumannii NOT DETECTED NOT DETECTED   Enterobacteriaceae species DETECTED (A) NOT DETECTED   Enterobacter cloacae complex NOT DETECTED NOT DETECTED   Escherichia coli DETECTED (A) NOT DETECTED   Klebsiella oxytoca NOT DETECTED NOT DETECTED   Klebsiella pneumoniae NOT DETECTED NOT DETECTED   Proteus species NOT DETECTED NOT DETECTED   Serratia marcescens NOT DETECTED NOT DETECTED   Carbapenem resistance NOT DETECTED NOT DETECTED   Haemophilus influenzae NOT DETECTED NOT DETECTED   Neisseria meningitidis NOT DETECTED NOT DETECTED   Pseudomonas aeruginosa NOT DETECTED NOT DETECTED   Candida albicans NOT DETECTED NOT DETECTED   Candida glabrata NOT DETECTED NOT DETECTED   Candida krusei NOT DETECTED NOT DETECTED   Candida parapsilosis NOT DETECTED NOT DETECTED   Candida tropicalis NOT DETECTED NOT DETECTED     Jolly Carlini 09/26/2017  2:57 PM

## 2017-09-27 LAB — URINE CULTURE: Culture: >=100000 — AB

## 2017-09-27 LAB — HIV ANTIBODY (ROUTINE TESTING W REFLEX): HIV Screen 4th Generation wRfx: NONREACTIVE

## 2017-09-27 NOTE — Progress Notes (Signed)
[place] at [place] NAME: Christine Abbott    MR#:  [id number]  DATE OF BIRTH:  02/09/1976  SUBJECTIVE:  Came in with high grade fever and found to have UTI. C/o flank pain No vomiting Had fever of 101.9 last night.  Tolerating p.o. diet and fluids  REVIEW OF SYSTEMS:   Review of Systems  Constitutional: Positive for chills and fever. Negative for weight loss.  HENT: Negative for ear discharge, ear pain and nosebleeds.   Eyes: Negative for blurred vision, pain and discharge.  Respiratory: Negative for sputum production, shortness of breath, wheezing and stridor.   Cardiovascular: Negative for chest pain, palpitations, orthopnea and PND.  Gastrointestinal: Negative for abdominal pain, diarrhea, nausea and vomiting.  Genitourinary: Positive for dysuria, flank pain and frequency. Negative for urgency.  Musculoskeletal: Negative for back pain and joint pain.  Neurological: Negative for sensory change, speech change, focal weakness and weakness.  Psychiatric/Behavioral: Negative for depression and hallucinations. The patient is not nervous/anxious.    Tolerating Diet:yes Tolerating PT: ambulatory  DRUG ALLERGIES:   Allergies  Allergen Reactions  . Ciprofloxacin Hives  . Macrobid [Nitrofurantoin] Hives  . Sulfa Antibiotics Hives and Other (See Comments)    VITALS:  Blood pressure (!) 145/87, pulse 80, temperature 98.7 F (37.1 C), temperature source Oral, resp. rate 18, height 5\' 2"  (1.575 m), weight 71.5 kg (157 lb 9.6 oz), last menstrual period 08/31/2017, SpO2 98 %, unknown if currently breastfeeding.  PHYSICAL EXAMINATION:   Physical Exam  GENERAL:  42 y.o.-year-old patient lying in the bed with no acute distress.  EYES: Pupils equal, round, reactive to light and accommodation. No scleral icterus. Extraocular muscles intact.  HEENT: Head atraumatic, normocephalic. Oropharynx and nasopharynx clear.  NECK:  Supple, no  jugular venous distention. No thyroid enlargement, no tenderness.  LUNGS: Normal breath sounds bilaterally, no wheezing, rales, rhonchi. No use of accessory muscles of respiration.  CARDIOVASCULAR: S1, S2 normal. No murmurs, rubs, or gallops.  ABDOMEN: Soft, nontender, nondistended. Bowel sounds present. No organomegaly or mass.  EXTREMITIES: No cyanosis, clubbing or edema b/l.    NEUROLOGIC: Cranial nerves II through XII are intact. No focal Motor or sensory deficits b/l.   PSYCHIATRIC:  patient is alert and oriented x 3.  SKIN: No obvious rash, lesion, or ulcer.   LABORATORY PANEL:  CBC Recent Labs  Lab 09/26/17 0506  WBC 9.0  HGB 11.6*  HCT 35.1  PLT 186    Chemistries  Recent Labs  Lab 09/25/17 1843 09/26/17 0506  NA 135 137  K 3.7 3.7  CL 108 112*  CO2 16* 20*  GLUCOSE 124* 99  BUN 14 9  CREATININE 0.79 0.61  CALCIUM 8.3* 7.6*  AST 64*  --   ALT 37  --   ALKPHOS 67  --   BILITOT 0.9  --    Cardiac Enzymes Recent Labs  Lab 09/25/17 1843  TROPONINI <0.03   RADIOLOGY:  Dg Chest Port 1 View  Result Date: 09/25/2017 CLINICAL DATA:  Sepsis.  Pyelonephritis.  Myalgias and fever. EXAM: PORTABLE CHEST 1 VIEW COMPARISON:  06/20/2012. FINDINGS: Cardiac silhouette and mediastinal contours normal in appearance for the AP portable technique. Pulmonary parenchyma clear. Bronchovascular markings normal. Pulmonary vascularity normal. No pneumothorax. No visible pleural effusions. IMPRESSION: No acute cardiopulmonary disease. Electronically Signed   By: Evangeline Dakin M.D.   On: 09/25/2017 19:07   ASSESSMENT AND PLAN:  Christine Abbott  is a 42 y.o. female with  a known history of Anxiety, recurrent UTI- has been having UTIs almost every month because of her  urethral reflux.she came to emergency room today morning with fever and having some nausea and noted to have UTI  * GNR sepsis Due to UTI. -Cont IV ceftriaxone -Recieved  IV fluids. -BC 1/4 positive for E coli -pt febrile  last nite  * recurrent UTI with reflux   She may be a candidate for long-term suppressive antibiotic therapy. Pt advised to also f/u with dr Jacqlyn Larsen  * active smoking   Counseled to quit smoking for 4 minutes and offered nicotine patch  Case discussed with Care Management/Social Worker. Management plans discussed with the patient, family and they are in agreement.  CODE STATUS: full DVT Prophylaxis: lovenox  TOTAL TIME TAKING CARE OF THIS PATIENT: *30* minutes.  >50% time spent on counselling and coordination of care  POSSIBLE D/C IN *1-2 DAYS, DEPENDING ON CLINICAL CONDITION.  Note: This dictation was prepared with Dragon dictation along with smaller phrase technology. Any transcriptional errors that result from this process are unintentional.  Fritzi Mandes M.D on 09/27/2017 at 7:23 AM  Between 7am to 6pm - Pager - [phone]  After 6pm go to [email or web address] - password [email or web address]  Office  [phone]  CC: Primary care physician; Idelle Crouch, MDPatient ID: Christine Abbott, female   DOB: 1975-08-02, 42 y.o.   MRN: [id number]

## 2017-09-28 LAB — URINE CULTURE: Culture: 20000 — AB

## 2017-09-28 MED ORDER — CEPHALEXIN 500 MG PO CAPS
500.0000 mg | ORAL_CAPSULE | Freq: Three times a day (TID) | ORAL | Status: DC
  Administered 2017-09-28: 500 mg via ORAL
  Filled 2017-09-28: qty 1

## 2017-09-28 MED ORDER — CEPHALEXIN 500 MG PO CAPS: 500.0000 mg | ORAL_CAPSULE | Freq: Three times a day (TID) | ORAL | 0 refills | Status: AC

## 2017-09-28 NOTE — Discharge Summary (Signed)
[place] at [place] NAME: Christine Abbott    MR#:  [id number]  DATE OF BIRTH:  1976/04/18  DATE OF ADMISSION:  09/25/2017 ADMITTING PHYSICIAN: Christine Basta, MD  DATE OF DISCHARGE: 09/28/2017  PRIMARY CARE PHYSICIAN: Christine Crouch, MD    ADMISSION DIAGNOSIS:  Sepsis, due to unspecified organism ([place]) [A41.9] Urinary tract infection without hematuria, site unspecified [N39.0]  DISCHARGE DIAGNOSIS:  E. coli sepsis E. coli UTI  SECONDARY DIAGNOSIS:   Past Medical History:  Diagnosis Date  . Advanced maternal age in multigravida 2017  . Anemia   . Anxiety   . UTI (urinary tract infection)     HOSPITAL COURSE:  KellyWebsteris a41 y.o.femalewith a known history of Anxiety, recurrent UTI- has been having UTIs almost every month because of her urethral reflux.she came to emergency room today morning with fever and having some nausea and noted to have UTI  * GNR sepsisDue to UTI. -ContIV ceftriaxone---changed to Keflex 500 3 times daily total duration 10 days -RecievedIV fluids. -BC 1/2 positive for E coli -Overall improving.  Low-grade fever. -WBC count normal  * recurrent UTI with reflux She may be a candidate for long-term suppressive antibiotic therapy. Pt advised to also f/u with dr Christine Abbott  * active smoking Counseled to quit smoking for 4 minutes and offered nicotine patch  Patient will discharge to home with outpatient follow-up with Dr. Jacqlyn Abbott urology and Dr. Doy Abbott PCP. Hemodynamically stable.  Discharge home later today CONSULTS OBTAINED:    DRUG ALLERGIES:   Allergies  Allergen Reactions  . Ciprofloxacin Hives  . Macrobid [Nitrofurantoin] Hives  . Sulfa Antibiotics Hives and Other (See Comments)    DISCHARGE MEDICATIONS:   Allergies as of 09/28/2017      Reactions   Ciprofloxacin Hives   Macrobid [nitrofurantoin] Hives   Sulfa Antibiotics Hives, Other (See Comments)       Medication List    STOP taking these medications   cefdinir 300 MG capsule Commonly known as:  OMNICEF   cefpodoxime 100 MG tablet Commonly known as:  VANTIN   HYDROcodone-acetaminophen 5-325 MG tablet Commonly known as:  NORCO/VICODIN   promethazine 12.5 MG tablet Commonly known as:  PHENERGAN     TAKE these medications   acetaminophen 500 MG tablet Commonly known as:  TYLENOL Take 1,000 mg by mouth every 6 (six) hours as needed for moderate pain or headache.   cephALEXin 500 MG capsule Commonly known as:  KEFLEX Take 1 capsule (500 mg total) by mouth 3 (three) times daily for 8 days.   doxylamine (Sleep) 25 MG tablet Commonly known as:  UNISOM Take 25 mg by mouth at bedtime as needed for sleep.   Ferrous Gluconate 256 (28 Fe) MG Tabs Take 256 mg by mouth daily.   ondansetron 4 MG disintegrating tablet Commonly known as:  ZOFRAN ODT Take 1 tablet (4 mg total) by mouth every 8 (eight) hours as needed for nausea or vomiting.       If you experience worsening of your admission symptoms, develop shortness of breath, life threatening emergency, suicidal or homicidal thoughts you must seek medical attention immediately by calling 911 or calling your MD immediately  if symptoms less severe.  You Must read complete instructions/literature along with all the possible adverse reactions/side effects for all the Medicines you take and that have been prescribed to you. Take any new Medicines after you have completely understood and accept all the possible adverse reactions/side effects.  Please note  You were cared for by a hospitalist during your hospital stay. If you have any questions about your discharge medications or the care you received while you were in the hospital after you are discharged, you can call the unit and asked to speak with the hospitalist on call if the hospitalist that took care of you is not available. Once you are discharged, your primary care physician  will handle any further medical issues. Please note that NO REFILLS for any discharge medications will be authorized once you are discharged, as it is imperative that you return to your primary care physician (or establish a relationship with a primary care physician if you do not have one) for your aftercare needs so that they can reassess your need for medications and monitor your lab values. Today   SUBJECTIVE   Doing well. Low grade fever last nite  VITAL SIGNS:  Blood pressure 117/68, pulse 84, temperature 98.4 F (36.9 C), temperature source Oral, resp. rate 18, height 5\' 2"  (1.575 m), weight 71.5 kg (157 lb 9.6 oz), last menstrual period 08/31/2017, SpO2 96 %, unknown if currently breastfeeding.  I/O:    Intake/Output Summary (Last 24 hours) at 09/28/2017 0731 Last data filed at 09/28/2017 0535 Gross per 24 hour  Intake 733.33 ml  Output 2050 ml  Net -1316.67 ml    PHYSICAL EXAMINATION:  GENERAL:  42 y.o.-year-old patient lying in the bed with no acute distress.  EYES: Pupils equal, round, reactive to light and accommodation. No scleral icterus. Extraocular muscles intact.  HEENT: Head atraumatic, normocephalic. Oropharynx and nasopharynx clear.  NECK:  Supple, no jugular venous distention. No thyroid enlargement, no tenderness.  LUNGS: Normal breath sounds bilaterally, no wheezing, rales,rhonchi or crepitation. No use of accessory muscles of respiration.  CARDIOVASCULAR: S1, S2 normal. No murmurs, rubs, or gallops.  ABDOMEN: Soft, non-tender, non-distended. Bowel sounds present. No organomegaly or mass.  EXTREMITIES: No pedal edema, cyanosis, or clubbing.  NEUROLOGIC: Cranial nerves II through XII are intact. Muscle strength 5/5 in all extremities. Sensation intact. Gait not checked.  PSYCHIATRIC: The patient is alert and oriented x 3.  SKIN: No obvious rash, lesion, or ulcer.   DATA REVIEW:   CBC  Recent Labs  Lab 09/26/17 0506  WBC 9.0  HGB 11.6*  HCT 35.1  PLT 186     Chemistries  Recent Labs  Lab 09/25/17 1843 09/26/17 0506  NA 135 137  K 3.7 3.7  CL 108 112*  CO2 16* 20*  GLUCOSE 124* 99  BUN 14 9  CREATININE 0.79 0.61  CALCIUM 8.3* 7.6*  AST 64*  --   ALT 37  --   ALKPHOS 67  --   BILITOT 0.9  --     Microbiology Results   Recent Results (from the past 240 hour(s))  Urine C&S     Status: Abnormal   Collection Time: 09/25/17  9:04 AM  Result Value Ref Range Status   Specimen Description   Final    URINE, RANDOM Performed at [facility], [address], [place], [place] [address]    Special Requests   Final    NONE Performed at [facility], [address] [place], [place] [address]    Culture >=100,000 COLONIES/mL ESCHERICHIA COLI (A)  Final   Report Status 09/27/2017 FINAL  Final   Organism ID, Bacteria ESCHERICHIA COLI (A)  Final      Susceptibility   Escherichia coli - MIC*    AMPICILLIN <=2  SENSITIVE Sensitive     CEFAZOLIN <=4 SENSITIVE Sensitive     CEFTRIAXONE <=1 SENSITIVE Sensitive     CIPROFLOXACIN <=0.25 SENSITIVE Sensitive     GENTAMICIN <=1 SENSITIVE Sensitive     IMIPENEM <=0.25 SENSITIVE Sensitive     NITROFURANTOIN <=16 SENSITIVE Sensitive     TRIMETH/SULFA <=20 SENSITIVE Sensitive     AMPICILLIN/SULBACTAM <=2 SENSITIVE Sensitive     PIP/TAZO <=4 SENSITIVE Sensitive     Extended ESBL NEGATIVE Sensitive     * >=100,000 COLONIES/mL ESCHERICHIA COLI  Blood Culture (routine x 2)     Status: Abnormal (Preliminary result)   Collection Time: 09/25/17  6:43 PM  Result Value Ref Range Status   Specimen Description   Final    BLOOD LAC Performed at [facility], [address] [place], [place] [address]    Special Requests   Final    BOTTLES DRAWN AEROBIC AND ANAEROBIC Blood Culture adequate volume Performed at [facility], [place], [place] [address]    Culture  Setup Time   Final    GRAM NEGATIVE RODS IN BOTH AEROBIC AND ANAEROBIC  BOTTLES CRITICAL RESULT CALLED TO, READ BACK BY AND VERIFIED WITH: KAREN HAYES ON 09/26/17 AT 1139 QSD    Culture (A)  Final    ESCHERICHIA COLI SUSCEPTIBILITIES TO FOLLOW Performed at [facility], [place] [address] [place], [place] [address]    Report Status PENDING  Incomplete  Blood Culture ID Panel (Reflexed)     Status: Abnormal   Collection Time: 09/25/17  6:43 PM  Result Value Ref Range Status   Enterococcus species NOT DETECTED NOT DETECTED Final   Listeria monocytogenes NOT DETECTED NOT DETECTED Final   Staphylococcus species NOT DETECTED NOT DETECTED Final   Staphylococcus aureus NOT DETECTED NOT DETECTED Final   Streptococcus species NOT DETECTED NOT DETECTED Final   Streptococcus agalactiae NOT DETECTED NOT DETECTED Final   Streptococcus pneumoniae NOT DETECTED NOT DETECTED Final   Streptococcus pyogenes NOT DETECTED NOT DETECTED Final   Acinetobacter baumannii NOT DETECTED NOT DETECTED Final   Enterobacteriaceae species DETECTED (A) NOT DETECTED Final    Comment: Enterobacteriaceae represent a large family of gram-negative bacteria, not a single organism. CRITICAL RESULT CALLED TO, READ BACK BY AND VERIFIED WITH: KAREN HAYES ON 09/26/17 AT 1139 QSD    Enterobacter cloacae complex NOT DETECTED NOT DETECTED Final   Escherichia coli DETECTED (A) NOT DETECTED Final    Comment: CRITICAL RESULT CALLED TO, READ BACK BY AND VERIFIED WITH: KAREN HAYES ON 09/26/17 AT 1139 QSD    Klebsiella oxytoca NOT DETECTED NOT DETECTED Final   Klebsiella pneumoniae NOT DETECTED NOT DETECTED Final   Proteus species NOT DETECTED NOT DETECTED Final   Serratia marcescens NOT DETECTED NOT DETECTED Final   Carbapenem resistance NOT DETECTED NOT DETECTED Final   Haemophilus influenzae NOT DETECTED NOT DETECTED Final   Neisseria meningitidis NOT DETECTED NOT DETECTED Final   Pseudomonas aeruginosa NOT DETECTED NOT DETECTED Final   Candida albicans NOT DETECTED NOT DETECTED Final   Candida  glabrata NOT DETECTED NOT DETECTED Final   Candida krusei NOT DETECTED NOT DETECTED Final   Candida parapsilosis NOT DETECTED NOT DETECTED Final   Candida tropicalis NOT DETECTED NOT DETECTED Final    Comment: Performed at [facility], [address] [place],  [address]  Blood Culture (routine x 2)     Status: None (Preliminary result)   Collection Time: 09/25/17  6:44 PM  Result  Value Ref Range Status   Specimen Description BLOOD RAC  Final   Special Requests   Final    BOTTLES DRAWN AEROBIC AND ANAEROBIC Blood Culture adequate volume   Culture   Final    NO GROWTH 3 DAYS Performed at [facility], [address] [place], [place] [address]    Report Status PENDING  Incomplete  Urine culture     Status: Abnormal (Preliminary result)   Collection Time: 09/25/17  6:44 PM  Result Value Ref Range Status   Specimen Description   Final    URINE, RANDOM Performed at [facility], [address], [place], [place] [address]    Special Requests   Final    NONE Performed at [facility], [address] [place], [place] [address]    Culture (A)  Final    20,000 COLONIES/mL ESCHERICHIA COLI SUSCEPTIBILITIES TO FOLLOW Performed at [facility], [place] [address] [place], [place] [address]    Report Status PENDING  Incomplete    RADIOLOGY:  No results found.   Management plans discussed with the patient, family and they are in agreement.  CODE STATUS:     Code Status Orders  (From admission, onward)        Start     Ordered   09/25/17 2131  Full code  Continuous     09/25/17 2130    Code Status History    Date Active Date Inactive Code Status Order ID Comments User Context   05/24/2016 11:25 05/27/2016 22:17 Full Code [id number]  Schermerhorn, Gwen Her, MD Inpatient    Advance Directive Documentation     Most Recent Value  Type of Advance Directive  Healthcare Power of Attorney  Pre-existing out of facility DNR order  (yellow form or pink MOST form)  No data  "MOST" Form in Place?  No data      TOTAL TIME TAKING CARE OF THIS PATIENT: *40* minutes.    Fritzi Mandes M.D on 09/28/2017 at 7:31 AM  Between 7am to 6pm - Pager - [phone] After 6pm go to [email or web address] - password [email or web address]  Office  [phone]  CC: Primary care physician; Christine Crouch, MD

## 2017-09-28 NOTE — Progress Notes (Signed)
09/28/2017  11:10 AM  Christine Abbott to be D/C'd Home per MD order.  Discussed prescriptions and follow up appointments with the patient. Prescriptions given to patient, medication list explained in detail. Pt verbalized understanding.  Allergies as of 09/28/2017      Reactions   Ciprofloxacin Hives   Macrobid [nitrofurantoin] Hives   Sulfa Antibiotics Hives, Other (See Comments)      Medication List    STOP taking these medications   cefdinir 300 MG capsule Commonly known as:  OMNICEF   cefpodoxime 100 MG tablet Commonly known as:  VANTIN   HYDROcodone-acetaminophen 5-325 MG tablet Commonly known as:  NORCO/VICODIN   promethazine 12.5 MG tablet Commonly known as:  PHENERGAN     TAKE these medications   acetaminophen 500 MG tablet Commonly known as:  TYLENOL Take 1,000 mg by mouth every 6 (six) hours as needed for moderate pain or headache.   cephALEXin 500 MG capsule Commonly known as:  KEFLEX Take 1 capsule (500 mg total) by mouth 3 (three) times daily for 8 days.   doxylamine (Sleep) 25 MG tablet Commonly known as:  UNISOM Take 25 mg by mouth at bedtime as needed for sleep.   Ferrous Gluconate 256 (28 Fe) MG Tabs Take 256 mg by mouth daily.   ondansetron 4 MG disintegrating tablet Commonly known as:  ZOFRAN ODT Take 1 tablet (4 mg total) by mouth every 8 (eight) hours as needed for nausea or vomiting.       Vitals:   09/27/17 2254 09/28/17 0535  BP:  117/68  Pulse:  84  Resp:  18  Temp: 98.5 F (36.9 C) 98.4 F (36.9 C)  SpO2:  96%    Skin clean, dry and intact without evidence of skin break down, no evidence of skin tears noted. IV catheter discontinued intact. Site without signs and symptoms of complications. Dressing and pressure applied. Pt denies pain at this time. No complaints noted.  An After Visit Summary was printed and given to the patient. Patient escorted via [place], and D/C home via private auto.  Dola Argyle

## 2017-09-29 LAB — CULTURE, BLOOD (ROUTINE X 2): SPECIAL REQUESTS: ADEQUATE

## 2017-09-30 LAB — CULTURE, BLOOD (ROUTINE X 2)
Culture: NO GROWTH
Special Requests: ADEQUATE

## 2017-10-09 ENCOUNTER — Encounter: Payer: Self-pay | Admitting: Urology

## 2017-10-09 ENCOUNTER — Ambulatory Visit (INDEPENDENT_AMBULATORY_CARE_PROVIDER_SITE_OTHER): Payer: Medicaid Other | Admitting: Urology

## 2017-10-09 VITALS — BP 111/75 | HR 84 | Ht 62.0 in | Wt 145.0 lb

## 2017-10-09 DIAGNOSIS — N390 Urinary tract infection, site not specified: Secondary | ICD-10-CM

## 2017-10-09 LAB — MICROSCOPIC EXAMINATION
BACTERIA UA: NONE SEEN
WBC, UA: NONE SEEN /hpf (ref 0–5)

## 2017-10-09 LAB — URINALYSIS, COMPLETE
Bilirubin, UA: NEGATIVE
GLUCOSE, UA: NEGATIVE
KETONES UA: NEGATIVE
LEUKOCYTES UA: NEGATIVE
NITRITE UA: NEGATIVE
Protein, UA: NEGATIVE
SPEC GRAV UA: 1.02 (ref 1.005–1.030)
Urobilinogen, Ur: 0.2 mg/dL (ref 0.2–1.0)
pH, UA: 5.5 (ref 5.0–7.5)

## 2017-10-09 MED ORDER — DOXYCYCLINE HYCLATE 100 MG PO CAPS: ORAL_CAPSULE | ORAL | 2 refills | Status: DC

## 2017-10-09 NOTE — Progress Notes (Signed)
10/09/2017 7:51 PM   Christine Abbott 10-26-1975 [id number]  Referring provider: Idelle Crouch, MD [facility] [place], [place] [address]  Chief Complaint  Patient presents with  . Recurrent UTI    New Patient    HPI: Christine Abbott is a 42 year old female seen in consultation at the request of Dr. Doy Hutching for evaluation of recurrent UTIs.  She was hospitalized at [facility] on 09/25/2017 with dysuria, malaise, nausea, vomiting and fever to 104 degrees.  Urine culture was positive for E. coli and 1/2 blood cultures was positive for E. coli.  She was discharged on Keflex which she has continued.  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  She states she saw Dr. Jacqlyn Larsen back in 2013 and was told that "urine reflexes from her urethra and the bladder".  She apparently underwent urethral dilation.  No records are available for review.  She estimates she has recently had a UTI at least 1/month.  She does note relation of symptom onset to within 24 hours of intercourse.  She was previously on low-dose suppression when she was followed by Dr. Jacqlyn Larsen and states this worked well.  She has had no recent upper tract imaging.  She voids with a good stream.  She does have baseline urinary urgency.   PMH: Past Medical History:  Diagnosis Date  . Advanced maternal age in multigravida 2017  . Anemia   . Anxiety   . UTI (urinary tract infection)     Surgical History: Past Surgical History:  Procedure Laterality Date  . ABDOMINAL HERNIA REPAIR    . BREAST ENHANCEMENT SURGERY    . CESAREAN SECTION    . CESAREAN SECTION WITH BILATERAL TUBAL LIGATION Left 05/24/2016   Procedure: CESAREAN SECTION WITH UNILATERAL TUBAL LIGATION;  Surgeon: Boykin Nearing, MD;  Location: [facility] ORS;  Service: Obstetrics;  Laterality: Left;  . SALPINGECTOMY      Home Medications:  Allergies as of 10/09/2017      Reactions   Ciprofloxacin Hives   Macrobid  [nitrofurantoin] Hives   Sulfa Antibiotics Hives, Other (See Comments)      Medication List        Accurate as of 10/09/17  7:51 PM. Always use your most recent med list.          acetaminophen 500 MG tablet Commonly known as:  TYLENOL Take 1,000 mg by mouth every 6 (six) hours as needed for moderate pain or headache.   doxylamine (Sleep) 25 MG tablet Commonly known as:  UNISOM Take 25 mg by mouth at bedtime as needed for sleep.   Ferrous Gluconate 256 (28 Fe) MG Tabs Take 256 mg by mouth daily.   ondansetron 4 MG disintegrating tablet Commonly known as:  ZOFRAN ODT Take 1 tablet (4 mg total) by mouth every 8 (eight) hours as needed for nausea or vomiting.       Allergies:  Allergies  Allergen Reactions  . Ciprofloxacin Hives  . Macrobid [Nitrofurantoin] Hives  . Sulfa Antibiotics Hives and Other (See Comments)    Family History: Family History  Problem Relation Age of Onset  . Hypertension Father     Social History:  reports that she has been smoking.  She has never used smokeless tobacco. She reports that she does not drink alcohol or use drugs.  ROS: UROLOGY Frequent Urination?: Yes Hard to postpone urination?: Yes Burning/pain with urination?: No Get up at night to urinate?: No Leakage of urine?:  Yes Urine stream starts and stops?: No Trouble starting stream?: No Do you have to strain to urinate?: No Blood in urine?: No Urinary tract infection?: No Sexually transmitted disease?: No Injury to kidneys or bladder?: No Painful intercourse?: No Weak stream?: No Currently pregnant?: No Vaginal bleeding?: No Last menstrual period?: n  Gastrointestinal Nausea?: No Vomiting?: No Indigestion/heartburn?: No Diarrhea?: Yes Constipation?: No  Constitutional Fever: No Night sweats?: No Weight loss?: No Fatigue?: No  Skin Skin rash/lesions?: No Itching?: No  Eyes Blurred vision?: No Double vision?: No  Ears/Nose/Throat Sore throat?:  No Sinus problems?: No  Hematologic/Lymphatic Swollen glands?: No Easy bruising?: No  Cardiovascular Leg swelling?: No Chest pain?: No  Respiratory Cough?: No Shortness of breath?: No  Endocrine Excessive thirst?: No  Musculoskeletal Back pain?: No Joint pain?: No  Neurological Headaches?: No Dizziness?: No  Psychologic Depression?: No Anxiety?: No  Physical Exam: BP 111/75   Pulse 84   Ht 5\' 2"  (1.575 m)   Wt 145 lb (65.8 kg)   BMI 26.52 kg/m   Constitutional:  Alert and oriented, No acute distress. HEENT: [place] AT, moist mucus membranes.  Trachea midline, no masses. Cardiovascular: No clubbing, cyanosis, or edema. Respiratory: Normal respiratory effort, no increased work of breathing. GI: Abdomen is soft, nontender, nondistended, no abdominal masses GU: No CVA tenderness Lymph: No cervical or inguinal lymphadenopathy. Skin: No rashes, bruises or suspicious lesions. Neurologic: Grossly intact, no focal deficits, moving all 4 extremities. Psychiatric: Normal mood and affect.  Laboratory Data: Lab Results  Component Value Date   WBC 9.0 09/26/2017   HGB 11.6 (L) 09/26/2017   HCT 35.1 09/26/2017   MCV 88.3 09/26/2017   PLT 186 09/26/2017    Lab Results  Component Value Date   CREATININE 0.61 09/26/2017     Urinalysis Microscopy is negative  Pertinent Imaging: N/A  Assessment & Plan:   Urinalysis today was clear.  She has had no recent evaluation and have recommended a CT urogram and cystoscopy.  Will place on postcoital prophylaxis with doxycycline 100 mg after intercourse.   Abbie Sons, [address] [place], [place] [address] [phone]

## 2017-10-21 ENCOUNTER — Ambulatory Visit
Admission: RE | Admit: 2017-10-21 | Discharge: 2017-10-21 | Disposition: A | Discharge status: Home / Self Care | Payer: Medicaid Other | Source: Ambulatory Visit | Attending: Urology | Admitting: Urology

## 2017-10-21 DIAGNOSIS — N390 Urinary tract infection, site not specified: Secondary | ICD-10-CM | POA: Insufficient documentation

## 2017-10-21 DIAGNOSIS — N852 Hypertrophy of uterus: Secondary | ICD-10-CM | POA: Diagnosis not present

## 2017-10-21 MED ORDER — IOPAMIDOL (ISOVUE-300) INJECTION 61%
125.0000 mL | Freq: Once | INTRAVENOUS | Status: AC | PRN
  Administered 2017-10-21: 125 mL via INTRAVENOUS

## 2017-10-23 ENCOUNTER — Telehealth: Payer: Self-pay

## 2017-10-23 NOTE — Telephone Encounter (Signed)
-----   Message from Abbie Sons, MD sent at 10/22/2017  7:59 AM EDT ----- CT showed no abnormalities

## 2017-10-23 NOTE — Telephone Encounter (Signed)
Letter sent.

## 2017-11-05 ENCOUNTER — Other Ambulatory Visit: Payer: Medicaid Other | Admitting: Urology

## 2017-11-13 ENCOUNTER — Encounter: Payer: Self-pay | Admitting: Urology

## 2017-11-13 ENCOUNTER — Ambulatory Visit (INDEPENDENT_AMBULATORY_CARE_PROVIDER_SITE_OTHER): Payer: Medicaid Other | Admitting: Urology

## 2017-11-13 VITALS — BP 116/79 | HR 75 | Resp 16 | Ht 62.0 in | Wt 146.5 lb

## 2017-11-13 DIAGNOSIS — N390 Urinary tract infection, site not specified: Secondary | ICD-10-CM | POA: Diagnosis not present

## 2017-11-13 MED ORDER — DOXYCYCLINE HYCLATE 100 MG PO TABS
100.0000 mg | ORAL_TABLET | Freq: Once | ORAL | Status: AC
  Administered 2017-11-13: 100 mg via ORAL

## 2017-11-13 MED ORDER — LIDOCAINE HCL URETHRAL/MUCOSAL 2 % EX GEL
1.0000 | Freq: Once | CUTANEOUS | Status: AC
Start: 2017-11-13 — End: 2017-11-13
  Administered 2017-11-13: 1 via URETHRAL

## 2017-11-13 NOTE — Progress Notes (Signed)
   11/13/17  CC: No chief complaint on file.   HPI: Recurrent UTI.  Refer to consult note dated 10/09/2017.  She is doing much better on postcoital prophylaxis. CT urogram showed no significant abnormalities.  Blood pressure 116/79, pulse 75, resp. rate 16, height 5\' 2"  (1.575 m), weight 146 lb 8 oz (66.5 kg), SpO2 98 %, unknown if currently breastfeeding. NED. A&Ox3.   No respiratory distress   Abd soft, NT, ND Normal external genitalia with patent urethral meatus  Cystoscopy Procedure Note  Patient identification was confirmed, informed consent was obtained, and patient was prepped using Betadine solution.  Lidocaine jelly was administered per urethral meatus.    Preoperative abx where received prior to procedure.    Procedure: - Flexible cystoscope introduced, without any difficulty.   - Thorough search of the bladder revealed:    normal urethral meatus    normal urothelium    no stones    no ulcers     no tumors    no urethral polyps    no trabeculation  - Ureteral orifices were normal in position and appearance.  Post-Procedure: - Patient tolerated the procedure well  Assessment/ Plan: No significant abnormalities identified on CT urogram or cystoscopy.  She is doing much better on postcoital prophylaxis which she will continue.  Follow-up annually or as needed for worsening symptoms.     Abbie Sons, MD

## 2017-11-14 LAB — URINALYSIS, COMPLETE
BILIRUBIN UA: NEGATIVE
GLUCOSE, UA: NEGATIVE
LEUKOCYTES UA: NEGATIVE
NITRITE UA: NEGATIVE
SPEC GRAV UA: 1.025 (ref 1.005–1.030)
UUROB: 0.2 mg/dL (ref 0.2–1.0)
pH, UA: 5.5 (ref 5.0–7.5)

## 2017-11-14 LAB — MICROSCOPIC EXAMINATION

## 2017-11-16 ENCOUNTER — Encounter: Payer: Self-pay | Admitting: Urology

## 2018-08-21 ENCOUNTER — Telehealth: Payer: Self-pay | Admitting: Urology

## 2018-08-21 MED ORDER — DOXYCYCLINE HYCLATE 100 MG PO CAPS: ORAL_CAPSULE | ORAL | 1 refills | Status: DC

## 2018-08-21 NOTE — Telephone Encounter (Signed)
Pt called and needs refill on doxycycline.  Thank You

## 2018-08-21 NOTE — Telephone Encounter (Signed)
Doxycycline sent to pharmacy with one refill.

## 2018-11-16 ENCOUNTER — Ambulatory Visit: Payer: Medicaid Other | Admitting: Urology

## 2018-11-18 ENCOUNTER — Telehealth (INDEPENDENT_AMBULATORY_CARE_PROVIDER_SITE_OTHER): Payer: BLUE CROSS/BLUE SHIELD | Admitting: Urology

## 2018-11-18 ENCOUNTER — Other Ambulatory Visit: Payer: Self-pay

## 2018-11-18 DIAGNOSIS — N390 Urinary tract infection, site not specified: Secondary | ICD-10-CM

## 2018-11-18 MED ORDER — DOXYCYCLINE HYCLATE 100 MG PO CAPS: ORAL_CAPSULE | ORAL | 1 refills | Status: DC

## 2018-11-18 NOTE — Progress Notes (Signed)
Virtual Visit via Video Note  I connected with Christine Abbott on 11/18/18 at  9:30 AM EDT by a video enabled telemedicine application and verified that I am speaking with the correct person using two identifiers.   I discussed the limitations of evaluation and management by telemedicine and the availability of in person appointments. The patient expressed understanding and agreed to proceed.  Urologic history: 1.  Recurrent UTI  -History of lower tract UTIs previously seen by Dr. Jacqlyn Larsen  -Hospitalized 09/2017 with febrile UTI and positive blood cultures  -CTU/cystoscopy without significant abnormalities  -Positive postcoital UTI symptoms and started on postcoital prophylaxis with doxycycline  History of Present Illness: 43 year old female presents for a video visit secondary to COVID-19 pandemic.  She was last seen April 2019 and states she has done well.  She remains on postcoital prophylaxis with good efficacy.  She has no complaints today. Denies dysuria, gross hematuria or flank/abdominal/pelvic/scrotal pain.   Observations/Objective: Alert, in no acute distress  Assessment and Plan: 43 year old female with history of recurrent UTIs.  Currently doing well on postcoital prophylaxis.  Doxycycline refill was sent to her pharmacy.  Continue annual follow-up and she is instructed to call as needed for any change or frequency of her symptoms.  Follow Up Instructions: Follow-up 1 year   I discussed the assessment and treatment plan with the patient. The patient was provided an opportunity to ask questions and all were answered. The patient agreed with the plan and demonstrated an understanding of the instructions.   The patient was advised to call back or seek an in-person evaluation if the symptoms worsen or if the condition fails to improve as anticipated.  I provided 5 minutes of non-face-to-face time during this encounter.   Abbie Sons, MD

## 2019-01-05 ENCOUNTER — Telehealth: Payer: Self-pay | Admitting: Urology

## 2019-01-05 NOTE — Telephone Encounter (Signed)
Patient called with symptoms of a UTI Burning, frequency, pressure I have scheduled her for a nurse visit on Thursday and she wanted to know if she can double up on her Doxycyline? She is a daily dose of it and wanted to know if that would help? Please advise   Sharyn Lull

## 2019-01-06 NOTE — Telephone Encounter (Signed)
I would not double up but wait until appointment tomorrow

## 2019-01-06 NOTE — Telephone Encounter (Signed)
Tried calling patient back to let her know but her mailbox was full and I was unable to leave a message.   Sharyn Lull

## 2019-01-07 ENCOUNTER — Encounter: Payer: Self-pay | Admitting: Urology

## 2019-01-07 ENCOUNTER — Ambulatory Visit: Payer: Medicaid Other

## 2019-03-25 ENCOUNTER — Other Ambulatory Visit: Payer: Self-pay

## 2019-03-25 DIAGNOSIS — Z20822 Contact with and (suspected) exposure to covid-19: Secondary | ICD-10-CM

## 2019-03-26 LAB — NOVEL CORONAVIRUS, NAA: SARS-CoV-2, NAA: NOT DETECTED

## 2019-09-10 IMAGING — CT CT ABD-PEL WO/W CM
2 of 8 series · 13 of 46 positions shown, 18 images · IV contrast (iopamidol)
Comparison: None.

CLINICAL DATA: UTI and sepsis.

EXAM:
CT ABDOMEN AND PELVIS WITHOUT AND WITH CONTRAST
TECHNIQUE: Multidetector CT imaging of the abdomen and pelvis was performed
following the standard protocol before and following the bolus
administration of intravenous contrast.
CONTRAST:  125mL [id number] IOPAMIDOL ([id number]) INJECTION 61%

[Series 6: cor without without pre · coronal · non-contrast · 0.66mm/px · 3 of 122 slices shown]
[im 31/122  soft-tissue]
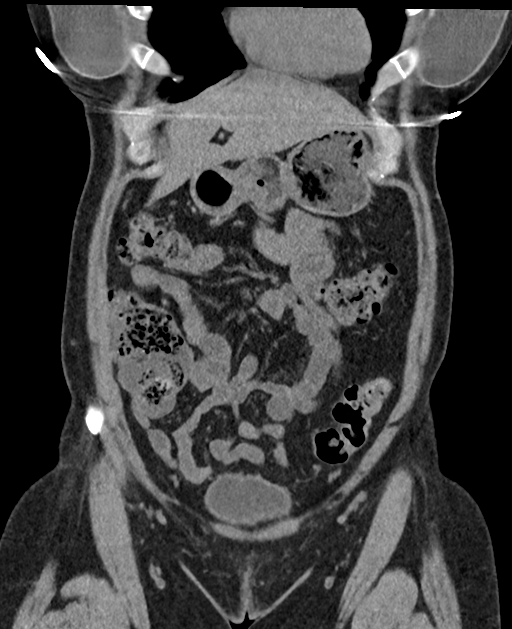
[im 61/122  soft-tissue]
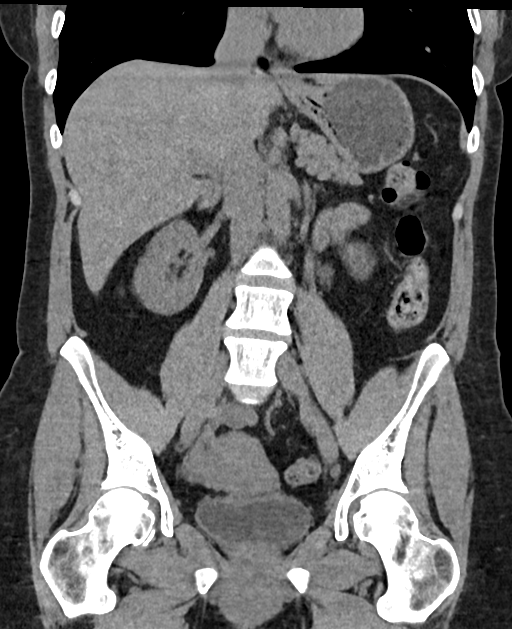
[im 91/122  soft-tissue]
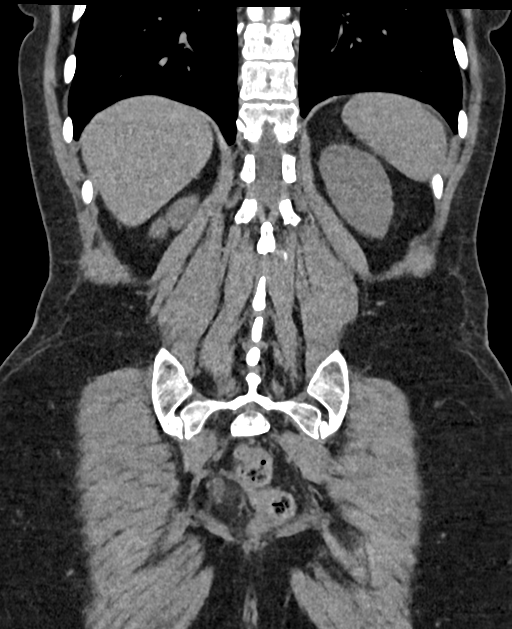

[Series 10: axial delay prone · axial · delayed · 0.66mm/px · z∈[-1514,-1154]mm · 10 of 88 slices shown, 15 images]
[im 8/88  soft-tissue]
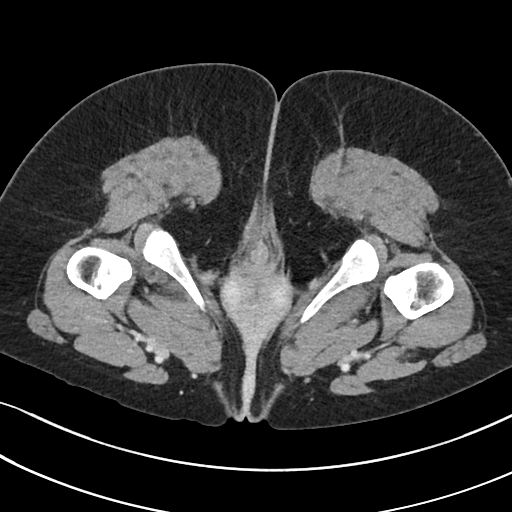
[im 8/88  bone]
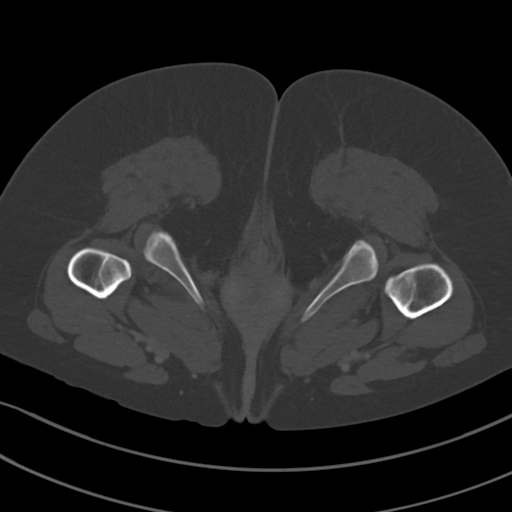
[im 15/88  soft-tissue]
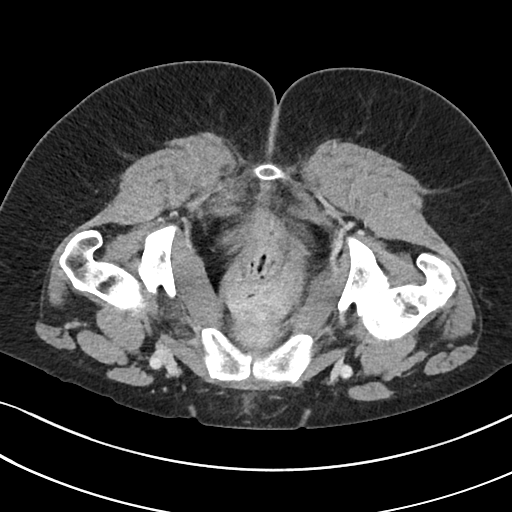
[im 30/88  soft-tissue]
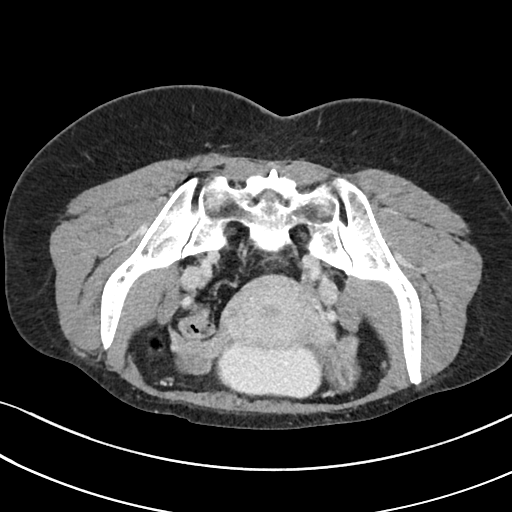
[im 37/88  soft-tissue]
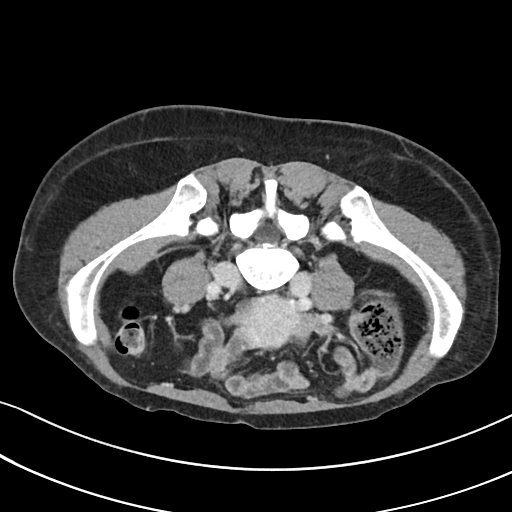
[im 44/88  soft-tissue]
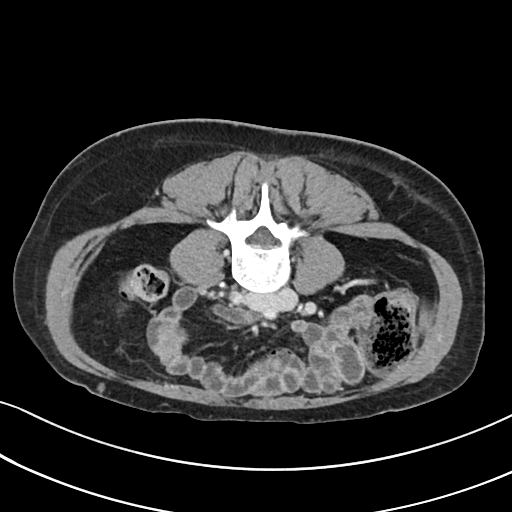
[im 51/88  soft-tissue]
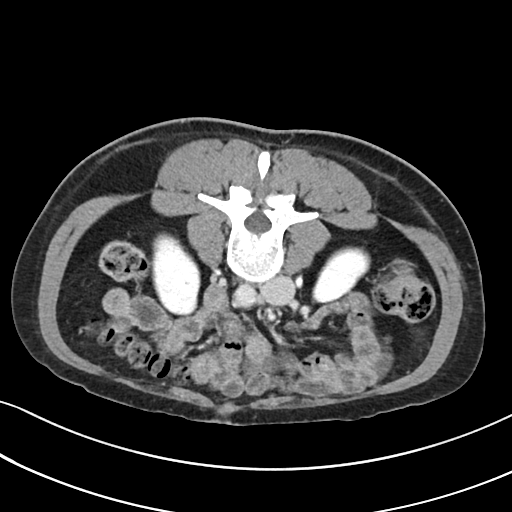
[im 59/88  soft-tissue]
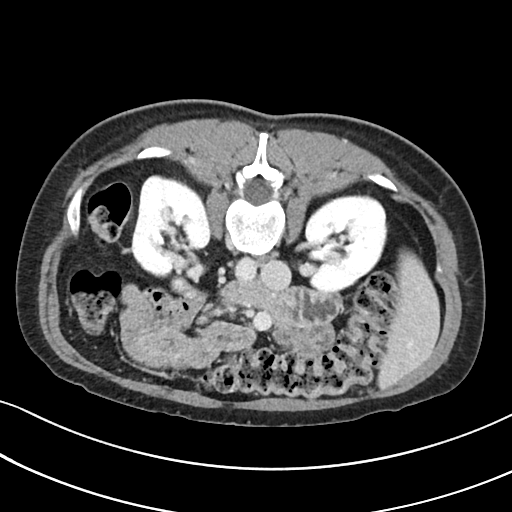
[im 59/88  lung]
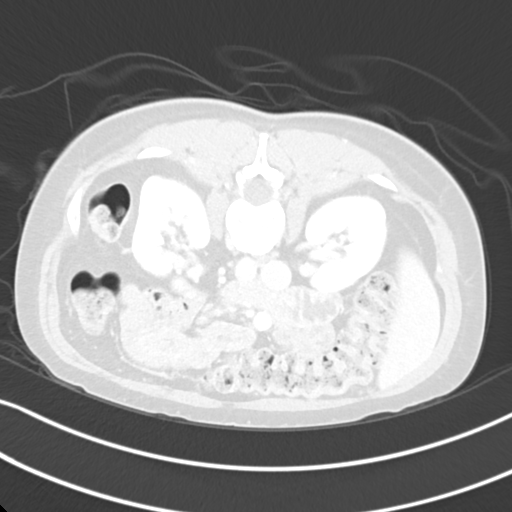
[im 66/88  lung]
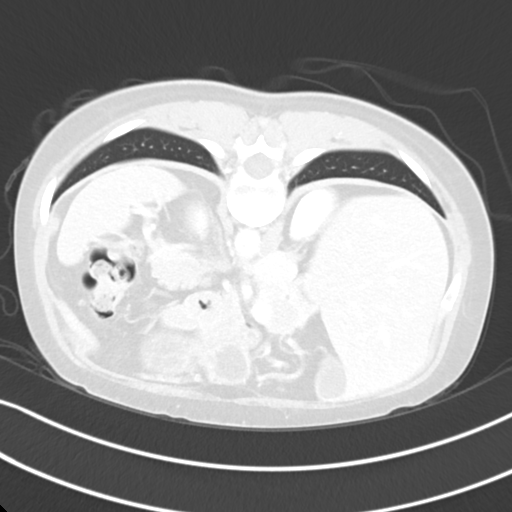
[im 73/88  soft-tissue]
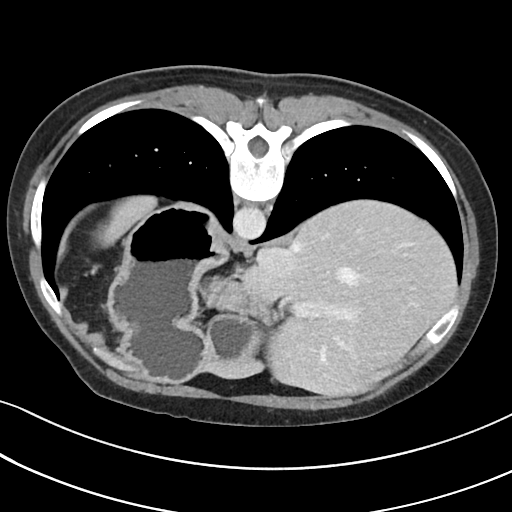
[im 73/88  lung]
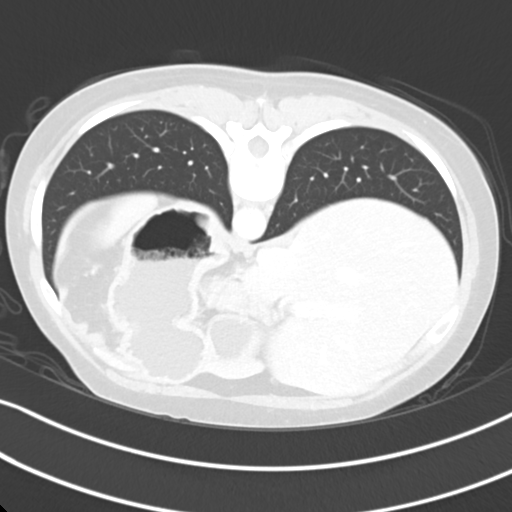
[im 80/88  soft-tissue]
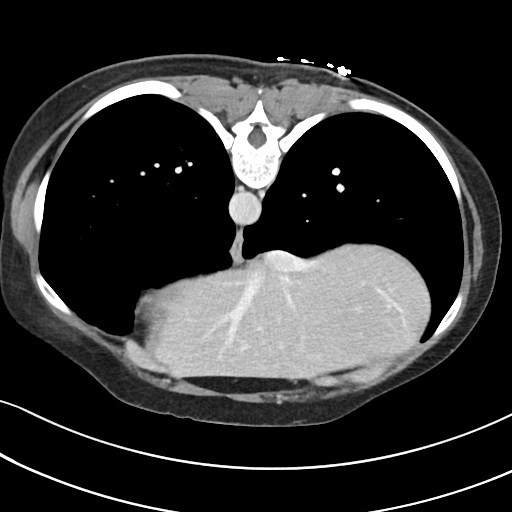
[im 80/88  lung]
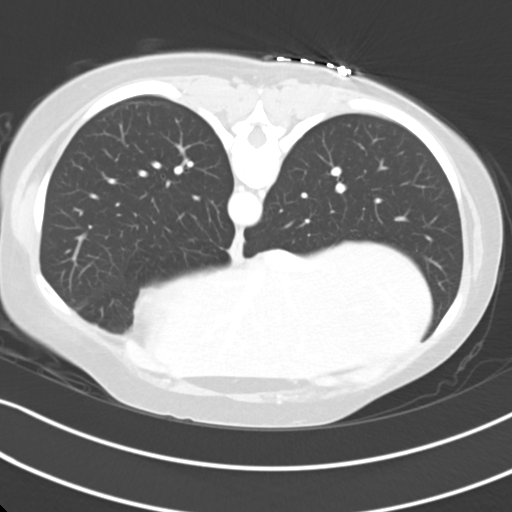
[im 80/88  bone]
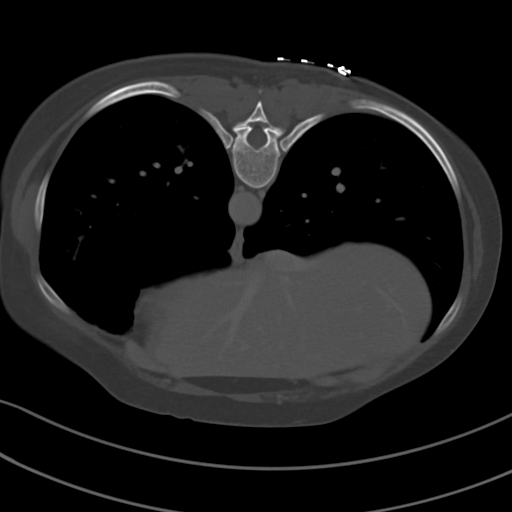

[13 of 46 positions shown; findings below may reference images not displayed]

FINDINGS: Lower chest: The lung bases are clear of acute process. No pleural
effusion or pulmonary lesions. The heart is normal in size. No
pericardial effusion. The distal esophagus and aorta are
unremarkable.

Hepatobiliary: No focal hepatic lesions or intrahepatic biliary
dilatation. The gallbladder is normal. No common bile duct
dilatation.

Pancreas: No mass, inflammation or ductal dilatation.

Spleen: Normal size.  No focal lesions.

Adrenals/Urinary Tract: The adrenal glands are unremarkable.

No renal, ureteral or bladder calculi. Both kidneys demonstrate
normal enhancement/perfusion. No worrisome renal lesions. Small
cysts are noted. No hydroureteronephrosis. No obstructing ureteral
calculi. Tiny calculus near the left distal ureter but clearly
outside of it is likely a phlebolith. No bladder lesions. No bladder
wall thickening.

Stomach/Bowel: The stomach, duodenum, small bowel and colon are
grossly normal without oral contrast. The terminal ileum and
appendix are normal.

Vascular/Lymphatic: The aorta is normal in caliber. No dissection.
The branch vessels are patent. The major venous structures are
patent. No mesenteric or retroperitoneal mass or adenopathy. Small
scattered lymph nodes are noted.

Reproductive: The uterus appears enlarged and may be recently
postpartum. C-section defects are noted in the lower uterine segment
anteriorly. The ovaries are unremarkable.

Other: No pelvic mass or adenopathy. No free pelvic fluid
collections. No inguinal mass or adenopathy. No abdominal wall
hernia or subcutaneous lesions.

Musculoskeletal: No significant bony findings.
IMPRESSION: 1. No renal, ureteral or bladder calculi or mass.
2. No acute abdominal/pelvic findings, mass lesions or
lymphadenopathy.
3. Mildly enlarged uterus, possibly recently postpartum. A small
amount of endocervical fluid.

## 2019-10-14 DIAGNOSIS — I10 Essential (primary) hypertension: Secondary | ICD-10-CM | POA: Insufficient documentation

## 2019-10-17 ENCOUNTER — Ambulatory Visit: Payer: Medicaid Other | Attending: Internal Medicine

## 2019-10-17 DIAGNOSIS — Z23 Encounter for immunization: Secondary | ICD-10-CM

## 2019-10-17 NOTE — Progress Notes (Signed)
   Covid-19 Vaccination Clinic  Name:  SHARL AHLQUIST    MRN: [id number] DOB: 09-Sep-1975  10/17/2019  Ms. Riddle was observed post Covid-19 immunization for 15 minutes without incident. She was provided with Vaccine Information Sheet and instruction to access the V-Safe system.   Ms. Killins was instructed to call 911 with any severe reactions post vaccine: Marland Kitchen Difficulty breathing  . Swelling of face and throat  . A fast heartbeat  . A bad rash all over body  . Dizziness and weakness   Immunizations Administered    Name Date Dose VIS Date Route   Pfizer COVID-19 Vaccine 10/17/2019 11:16 AM 0.3 mL 07/02/2019 Intramuscular   Manufacturer: [place]   Lot: [id number]   [place]: [id number]

## 2019-11-09 ENCOUNTER — Ambulatory Visit: Payer: Medicaid Other | Attending: Internal Medicine

## 2019-11-09 DIAGNOSIS — Z23 Encounter for immunization: Secondary | ICD-10-CM

## 2019-11-09 NOTE — Progress Notes (Signed)
   Covid-19 Vaccination Clinic  Name:  Christine Abbott    MRN: [id number] DOB: [birth year]  11/09/2019  [name] was observed post Covid-19 immunization for 15 minutes without incident. She was provided with Vaccine Information Sheet and instruction to access the V-Safe system.   [name] was instructed to call 911 with any severe reactions post vaccine: Marland Kitchen Difficulty breathing  . Swelling of face and throat  . A fast heartbeat  . A bad rash all over body  . Dizziness and weakness   Immunizations Administered    Name Date Dose VIS Date Route   Pfizer COVID-19 Vaccine 11/09/2019  2:33 PM 0.3 mL 09/15/2018 Intramuscular   Manufacturer: [place]   Lot: [id number]   [place]: [id number]

## 2019-11-18 ENCOUNTER — Encounter: Payer: Self-pay | Admitting: Urology

## 2019-11-18 ENCOUNTER — Ambulatory Visit: Payer: Medicaid Other | Admitting: Urology

## 2020-02-04 ENCOUNTER — Encounter: Payer: Self-pay | Admitting: Physician Assistant

## 2020-02-04 ENCOUNTER — Other Ambulatory Visit: Payer: Self-pay

## 2020-02-04 ENCOUNTER — Ambulatory Visit (INDEPENDENT_AMBULATORY_CARE_PROVIDER_SITE_OTHER): Payer: BC Managed Care – PPO | Admitting: Physician Assistant

## 2020-02-04 VITALS — BP 180/107 | HR 98 | Ht 63.0 in | Wt 145.0 lb

## 2020-02-04 DIAGNOSIS — D509 Iron deficiency anemia, unspecified: Secondary | ICD-10-CM | POA: Insufficient documentation

## 2020-02-04 DIAGNOSIS — N390 Urinary tract infection, site not specified: Secondary | ICD-10-CM

## 2020-02-04 MED ORDER — DOXYCYCLINE HYCLATE 100 MG PO CAPS: ORAL_CAPSULE | ORAL | 2 refills | Status: DC

## 2020-02-04 MED ORDER — CEPHALEXIN 500 MG PO CAPS: 500.0000 mg | ORAL_CAPSULE | Freq: Two times a day (BID) | ORAL | 0 refills | Status: AC

## 2020-02-04 NOTE — Progress Notes (Signed)
02/04/2020 4:23 PM   Christine Abbott 1976-01-01 [id number]  CC: Chief Complaint  Patient presents with  . Recurrent UTI    HPI: Christine Abbott is a 44 y.o. female with PMH rUTI on postcoital prophylaxis with doxycycline who presents today for evaluation of possible UTI.  Today she reports a 2-day history of lower abdominal pressure and urinary frequency.  She took doxycycline yesterday but notes that her symptoms have continued.  CT hematuria completed on 10/21/2017 revealed no renal, ureteral, or bladder calculi.  In-office UA today positive for 2+ blood; urine microscopy with 6-10 WBCs/HPF and 3-10 RBCs/HPF.  PMH: Past Medical History:  Diagnosis Date  . Advanced maternal age in multigravida 2017  . Anemia   . Anxiety   . UTI (urinary tract infection)     Surgical History: Past Surgical History:  Procedure Laterality Date  . ABDOMINAL HERNIA REPAIR    . BREAST ENHANCEMENT SURGERY    . CESAREAN SECTION    . CESAREAN SECTION WITH BILATERAL TUBAL LIGATION Left 05/24/2016   Procedure: CESAREAN SECTION WITH UNILATERAL TUBAL LIGATION;  Surgeon: Boykin Nearing, MD;  Location: [facility] ORS;  Service: Obstetrics;  Laterality: Left;  . SALPINGECTOMY      Home Medications:  Allergies as of 02/04/2020      Reactions   Ciprofloxacin Hives   Macrobid [nitrofurantoin] Hives   Sulfa Antibiotics Hives, Other (See Comments)      Medication List       Accurate as of February 04, 2020  4:23 PM. If you have any questions, ask your nurse or doctor.        STOP taking these medications   acetaminophen 500 MG tablet Commonly known as: TYLENOL Stopped by: Debroah Loop, PA-C   doxylamine (Sleep) 25 MG tablet Commonly known as: UNISOM Stopped by: Debroah Loop, PA-C   Ferrous Gluconate 256 (28 Fe) MG Tabs Stopped by: Debroah Loop, PA-C   ondansetron 4 MG disintegrating tablet Commonly known as: Zofran ODT Stopped by: Debroah Loop, PA-C      TAKE these medications   ALPRAZolam 0.5 MG tablet Commonly known as: XANAX Take 0.5 mg by mouth 2 (two) times daily as needed.   cephALEXin 500 MG capsule Commonly known as: Keflex Take 1 capsule (500 mg total) by mouth 2 (two) times daily for 7 days. Started by: Debroah Loop, PA-C   doxycycline 100 MG capsule Commonly known as: VIBRAMYCIN 1 capsule po after intercourse   propranolol 10 MG tablet Commonly known as: INDERAL Take 10 mg by mouth 2 (two) times daily.       Allergies:  Allergies  Allergen Reactions  . Ciprofloxacin Hives  . Macrobid [Nitrofurantoin] Hives  . Sulfa Antibiotics Hives and Other (See Comments)    Family History: Family History  Problem Relation Age of Onset  . Hypertension Father     Social History:   reports that she has been smoking. She has never used smokeless tobacco. She reports that she does not drink alcohol and does not use drugs.  Physical Exam: BP (!) 180/107 (BP Location: Left Arm, Patient Position: Sitting, Cuff Size: Normal)   Pulse 98   Ht 5\' 3"  (1.6 m)   Wt 145 lb (65.8 kg)   LMP 01/27/2020 (Approximate)   Breastfeeding No   BMI 25.69 kg/m   Constitutional:  Alert and oriented, no acute distress, nontoxic appearing HEENT: [place], AT Cardiovascular: No clubbing, cyanosis, or edema Respiratory: Normal respiratory effort, no increased work of breathing Skin: No  rashes, bruises or suspicious lesions Neurologic: Grossly intact, no focal deficits, moving all 4 extremities Psychiatric: Normal mood and affect  Laboratory Data: Results for orders placed or performed in visit on 02/04/20  CULTURE, URINE COMPREHENSIVE   Specimen: Urine   UR  Result Value Ref Range   Urine Culture, Comprehensive Preliminary report (A)    Organism ID, Bacteria Gram negative rods (A)    Organism ID, Bacteria Comment    Organism ID, Bacteria Comment   Microscopic Examination   Urine  Result Value Ref Range   WBC, UA 6-10 (A) 0 - 5  /hpf   RBC 3-10 (A) 0 - 2 /hpf   Epithelial Cells (non renal) 0-10 0 - 10 /hpf   Bacteria, UA Few None seen/Few  Urinalysis, Complete  Result Value Ref Range   Specific Gravity, UA 1.020 1.005 - 1.030   pH, UA 6.0 5.0 - 7.5   Color, UA Yellow Yellow   Appearance Ur Hazy (A) Clear   Leukocytes,UA Negative Negative   Protein,UA Negative Negative/Trace   Glucose, UA Negative Negative   Ketones, UA Negative Negative   RBC, UA 2+ (A) Negative   Bilirubin, UA Negative Negative   Urobilinogen, Ur 0.2 0.2 - 1.0 mg/dL   Nitrite, UA Negative Negative   Microscopic Examination See below:    Assessment & Plan:   1. Recurrent UTI UA notable for mild pyuria and microscopic hematuria, consistent with possible episode of acute cystitis with recent doxycycline use.  Will send urine for culture for further evaluation and start the patient on empiric Keflex.  Also refilling prophylactic doxycycline.  Counseled the patient that I like her to return in 1 week to drop off a urine sample for repeat UA to prove resolution of hematuria.  She expressed understanding. - Urinalysis, Complete - CULTURE, URINE COMPREHENSIVE - cephALEXin (KEFLEX) 500 MG capsule; Take 1 capsule (500 mg total) by mouth 2 (two) times daily for 7 days.  Dispense: 14 capsule; Refill: 0 - doxycycline (VIBRAMYCIN) 100 MG capsule; 1 capsule po after intercourse  Dispense: 30 capsule; Refill: 2 - Urinalysis, Complete; Future   Return in about 1 week (around 02/11/2020) for Lab visit for UA.  Debroah Loop, PA-C  [facility] [address] [place], [place] [address] [phone]

## 2020-02-04 NOTE — Patient Instructions (Signed)

## 2020-02-05 LAB — URINALYSIS, COMPLETE
Bilirubin, UA: NEGATIVE
Glucose, UA: NEGATIVE
Ketones, UA: NEGATIVE
Leukocytes,UA: NEGATIVE
Nitrite, UA: NEGATIVE
Protein,UA: NEGATIVE
Specific Gravity, UA: 1.02 (ref 1.005–1.030)
Urobilinogen, Ur: 0.2 mg/dL (ref 0.2–1.0)
pH, UA: 6 (ref 5.0–7.5)

## 2020-02-05 LAB — MICROSCOPIC EXAMINATION

## 2020-02-10 LAB — CULTURE, URINE COMPREHENSIVE

## 2020-02-14 ENCOUNTER — Other Ambulatory Visit: Payer: Self-pay

## 2020-02-15 ENCOUNTER — Other Ambulatory Visit: Payer: Self-pay

## 2020-02-15 ENCOUNTER — Other Ambulatory Visit: Payer: BC Managed Care – PPO

## 2020-02-15 DIAGNOSIS — N390 Urinary tract infection, site not specified: Secondary | ICD-10-CM

## 2020-02-16 LAB — URINALYSIS, COMPLETE
Bilirubin, UA: NEGATIVE
Glucose, UA: NEGATIVE
Ketones, UA: NEGATIVE
Leukocytes,UA: NEGATIVE
Nitrite, UA: NEGATIVE
Specific Gravity, UA: 1.02 (ref 1.005–1.030)
Urobilinogen, Ur: 0.2 mg/dL (ref 0.2–1.0)
pH, UA: 6.5 (ref 5.0–7.5)

## 2020-02-16 LAB — MICROSCOPIC EXAMINATION: Epithelial Cells (non renal): >10 /hpf — AB (ref 0–10)

## 2020-05-18 ENCOUNTER — Other Ambulatory Visit: Payer: Self-pay | Admitting: Internal Medicine

## 2020-05-18 DIAGNOSIS — Z1231 Encounter for screening mammogram for malignant neoplasm of breast: Secondary | ICD-10-CM

## 2020-06-20 ENCOUNTER — Other Ambulatory Visit: Payer: Self-pay | Admitting: Physician Assistant

## 2020-06-20 DIAGNOSIS — N390 Urinary tract infection, site not specified: Secondary | ICD-10-CM

## 2021-01-24 ENCOUNTER — Other Ambulatory Visit: Payer: Self-pay | Admitting: Internal Medicine

## 2021-01-24 ENCOUNTER — Ambulatory Visit
Admission: RE | Admit: 2021-01-24 | Discharge: 2021-01-24 | Disposition: A | Discharge status: Home / Self Care | Payer: BC Managed Care – PPO | Source: Ambulatory Visit | Attending: Internal Medicine | Admitting: Internal Medicine

## 2021-01-24 ENCOUNTER — Other Ambulatory Visit: Payer: Self-pay

## 2021-01-24 DIAGNOSIS — Z1231 Encounter for screening mammogram for malignant neoplasm of breast: Secondary | ICD-10-CM | POA: Diagnosis not present

## 2021-01-30 ENCOUNTER — Other Ambulatory Visit: Payer: Self-pay | Admitting: Internal Medicine

## 2021-01-30 DIAGNOSIS — N6320 Unspecified lump in the left breast, unspecified quadrant: Secondary | ICD-10-CM

## 2021-01-30 DIAGNOSIS — R928 Other abnormal and inconclusive findings on diagnostic imaging of breast: Secondary | ICD-10-CM

## 2021-01-30 DIAGNOSIS — N6310 Unspecified lump in the right breast, unspecified quadrant: Secondary | ICD-10-CM

## 2021-02-02 ENCOUNTER — Ambulatory Visit
Admission: RE | Admit: 2021-02-02 | Discharge: 2021-02-02 | Disposition: A | Discharge status: Home / Self Care | Payer: BC Managed Care – PPO | Source: Ambulatory Visit | Attending: Internal Medicine | Admitting: Internal Medicine

## 2021-02-02 ENCOUNTER — Other Ambulatory Visit: Payer: Self-pay

## 2021-02-02 DIAGNOSIS — N6320 Unspecified lump in the left breast, unspecified quadrant: Secondary | ICD-10-CM | POA: Diagnosis present

## 2021-02-02 DIAGNOSIS — N6310 Unspecified lump in the right breast, unspecified quadrant: Secondary | ICD-10-CM

## 2021-02-02 DIAGNOSIS — R928 Other abnormal and inconclusive findings on diagnostic imaging of breast: Secondary | ICD-10-CM | POA: Diagnosis present

## 2021-06-27 ENCOUNTER — Ambulatory Visit: Payer: Medicaid Other | Admitting: Dermatology

## 2021-07-25 ENCOUNTER — Other Ambulatory Visit: Payer: Self-pay | Admitting: Internal Medicine

## 2021-07-25 DIAGNOSIS — N6320 Unspecified lump in the left breast, unspecified quadrant: Secondary | ICD-10-CM

## 2021-09-17 ENCOUNTER — Encounter: Payer: Self-pay | Admitting: Dermatology

## 2021-09-17 ENCOUNTER — Ambulatory Visit (INDEPENDENT_AMBULATORY_CARE_PROVIDER_SITE_OTHER): Payer: BC Managed Care – PPO | Admitting: Dermatology

## 2021-09-17 ENCOUNTER — Other Ambulatory Visit: Payer: Self-pay

## 2021-09-17 DIAGNOSIS — L988 Other specified disorders of the skin and subcutaneous tissue: Secondary | ICD-10-CM

## 2021-09-17 DIAGNOSIS — L738 Other specified follicular disorders: Secondary | ICD-10-CM | POA: Diagnosis not present

## 2021-09-17 DIAGNOSIS — L821 Other seborrheic keratosis: Secondary | ICD-10-CM

## 2021-09-17 DIAGNOSIS — D229 Melanocytic nevi, unspecified: Secondary | ICD-10-CM

## 2021-09-17 DIAGNOSIS — L578 Other skin changes due to chronic exposure to nonionizing radiation: Secondary | ICD-10-CM

## 2021-09-17 DIAGNOSIS — L814 Other melanin hyperpigmentation: Secondary | ICD-10-CM

## 2021-09-17 DIAGNOSIS — D1800 Hemangioma unspecified site: Secondary | ICD-10-CM

## 2021-09-17 DIAGNOSIS — L820 Inflamed seborrheic keratosis: Secondary | ICD-10-CM | POA: Diagnosis not present

## 2021-09-17 DIAGNOSIS — Z1283 Encounter for screening for malignant neoplasm of skin: Secondary | ICD-10-CM

## 2021-09-17 DIAGNOSIS — D225 Melanocytic nevi of trunk: Secondary | ICD-10-CM | POA: Diagnosis not present

## 2021-09-17 DIAGNOSIS — Z86018 Personal history of other benign neoplasm: Secondary | ICD-10-CM

## 2021-09-17 MED ORDER — TRETINOIN 0.025 % EX CREA: TOPICAL_CREAM | CUTANEOUS | 3 refills | Status: AC

## 2021-09-17 NOTE — Progress Notes (Signed)
New Patient Visit  Subjective  Christine Abbott is a 46 y.o. female who presents for the following: Annual Exam (Here for skin cancer screening. Full body. Hx of multiple dysplastic nevi. Several areas of concern). The patient presents for Total-Body Skin Exam (TBSE) for skin cancer screening and mole check.  The patient has spots, moles and lesions to be evaluated, some may be new or changing and the patient has concerns that these could be cancer.  Objective  Well appearing patient in no apparent distress; mood and affect are within normal limits.  A full examination was performed including scalp, head, eyes, ears, nose, lips, neck, chest, axillae, abdomen, back, buttocks, bilateral upper extremities, bilateral lower extremities, hands, feet, fingers, toes, fingernails, and toenails. All findings within normal limits unless otherwise noted below.  Back x2, left pretibial x1 (3) Erythematous keratotic or waxy stuck-on papule or plaque.  Right Malar Cheek 0.3 cm flesh papule. Examined under dermoscopy, consistent with sebaceous hyperplasia.  Present for years without change, per patient.      Left Lateral Breast Regular brown papule     face Rhytides and volume loss.    Assessment & Plan   Lentigines - Scattered tan macules - Due to sun exposure - Benign-appearing, observe - Recommend daily broad spectrum sunscreen SPF 30+ to sun-exposed areas, reapply every 2 hours as needed. - Call for any changes  Seborrheic Keratoses - Stuck-on, waxy, tan-brown papules and/or plaques  - Benign-appearing - Discussed benign etiology and prognosis. - Observe - Call for any changes  Melanocytic Nevi - Tan-brown and/or pink-flesh-colored symmetric macules and papules - Benign appearing on exam today - Observation - Call clinic for new or changing moles - Recommend daily use of broad spectrum spf 30+ sunscreen to sun-exposed areas.   Hemangiomas - Red papules - Discussed  benign nature - Observe - Call for any changes  Actinic Damage - Chronic condition, secondary to cumulative UV/sun exposure - diffuse scaly erythematous macules with underlying dyspigmentation - Recommend daily broad spectrum sunscreen SPF 30+ to sun-exposed areas, reapply every 2 hours as needed.  - Staying in the shade or wearing long sleeves, sun glasses (UVA+UVB protection) and wide brim hats (4-inch brim around the entire circumference of the hat) are also recommended for sun protection.  - Call for new or changing lesions.  Skin cancer screening performed today.  History of Dysplastic Nevi - No evidence of recurrence today - Recommend regular full body skin exams - Recommend daily broad spectrum sunscreen SPF 30+ to sun-exposed areas, reapply every 2 hours as needed.  - Call if any new or changing lesions are noted between office visits   Inflamed seborrheic keratosis (3) Back x2, left pretibial x1  Destruction of lesion - Back x2, left pretibial x1 Complexity: simple   Destruction method: cryotherapy   Informed consent: discussed and consent obtained   Timeout:  patient name, date of birth, surgical site, and procedure verified Lesion destroyed using liquid nitrogen: Yes   Region frozen until ice ball extended beyond lesion: Yes   Outcome: patient tolerated procedure well with no complications   Post-procedure details: wound care instructions given    Sebaceous hyperplasia Right Malar Cheek- medial See photo Growth has been present for many years without change at all. Dermatoscopic exam appears benign today. Benign-appearing.  Observation.  Call clinic for new or changing lesions.  Recommend daily use of broad spectrum spf 30+ sunscreen to sun-exposed areas.  Recheck in 3 months.  Consider biopsy if  change.  Nevus Left Lateral Breast See photo Benign-appearing.  Observation.  Call clinic for new or changing lesions.  Recommend daily use of broad spectrum spf 30+  sunscreen to sun-exposed areas.    Elastosis of skin face Start Tretinoin 0.025% cream apply pea-sized amount to face at bedtime, wash off in morning.   Recommend daily broad spectrum sunscreen SPF 30+ to sun-exposed areas, reapply every 2 hours as needed. Call for new or changing lesions.  Staying in the shade or wearing long sleeves, sun glasses (UVA+UVB protection) and wide brim hats (4-inch brim around the entire circumference of the hat) are also recommended for sun protection.    Discussed fillers and Botox.  tretinoin (RETIN-A) 0.025 % cream - face Apply pea-sized amount to face at bedtime, wash off in morning.  Return in about 1 year (around 09/17/2022) for TBSE, 3 months recheck face.  I, Emelia Salisbury, CMA, am acting as scribe for Sarina Ser, MD. Documentation: I have reviewed the above documentation for accuracy and completeness, and I agree with the above.  Sarina Ser, MD

## 2021-09-17 NOTE — Patient Instructions (Addendum)
Cryotherapy Aftercare  Wash gently with soap and water everyday.   Apply Vaseline and Band-Aid daily until healed.   Prior to procedure, discussed risks of blister formation, small wound, skin dyspigmentation, or rare scar following cryotherapy. Recommend Vaseline ointment to treated areas while healing.    Start Tretinoin 0.025% cream apply pea-sized amount to face at bedtime, wash off in morning.   Recommend daily broad spectrum sunscreen SPF 30+ to sun-exposed areas, reapply every 2 hours as needed. Call for new or changing lesions.  Staying in the shade or wearing long sleeves, sun glasses (UVA+UVB protection) and wide brim hats (4-inch brim around the entire circumference of the hat) are also recommended for sun protection.     Topical retinoid medications like tretinoin/Retin-A, adapalene/Differin, tazarotene/Fabior, and Epiduo/Epiduo Forte can cause dryness and irritation when first started. Only apply a pea-sized amount to the entire affected area. Avoid applying it around the eyes, edges of mouth and creases at the nose. If you experience irritation, use a good moisturizer first and/or apply the medicine less often. If you are doing well with the medicine, you can increase how often you use it until you are applying every night. Be careful with sun protection while using this medication as it can make you sensitive to the sun. This medicine should not be used by pregnant women.        Melanoma ABCDEs  Melanoma is the most dangerous type of skin cancer, and is the leading cause of death from skin disease.  You are more likely to develop melanoma if you: Have light-colored skin, light-colored eyes, or red or blond hair Spend a lot of time in the sun Tan regularly, either outdoors or in a tanning bed Have had blistering sunburns, especially during childhood Have a close family member who has had a melanoma Have atypical moles or large birthmarks  Early detection of melanoma is key  since treatment is typically straightforward and cure rates are extremely high if we catch it early.   The first sign of melanoma is often a change in a mole or a new dark spot.  The ABCDE system is a way of remembering the signs of melanoma.  A for asymmetry:  The two halves do not match. B for border:  The edges of the growth are irregular. C for color:  A mixture of colors are present instead of an even brown color. D for diameter:  Melanomas are usually (but not always) greater than 19mm - the size of a pencil eraser. E for evolution:  The spot keeps changing in size, shape, and color.  Please check your skin once per month between visits. You can use a small mirror in front and a large mirror behind you to keep an eye on the back side or your body.   If you see any new or changing lesions before your next follow-up, please call to schedule a visit.  Please continue daily skin protection including broad spectrum sunscreen SPF 30+ to sun-exposed areas, reapplying every 2 hours as needed when you're outdoors.   Staying in the shade or wearing long sleeves, sun glasses (UVA+UVB protection) and wide brim hats (4-inch brim around the entire circumference of the hat) are also recommended for sun protection.     If You Need Anything After Your Visit  If you have any questions or concerns for your doctor, please call our main line at [phone] and press option 4 to reach your doctor's medical assistant. If no one  answers, please leave a voicemail as directed and we will return your call as soon as possible. Messages left after 4 pm will be answered the following business day.   You may also send [place] a message via [place]. We typically respond to MyChart messages within 1-2 business days.  For prescription refills, please ask your pharmacy to contact our office. Our fax number is [phone].  If you have an urgent issue when the clinic is closed that cannot wait until the next business day,  you can page your doctor at the number below.    Please note that while we do our best to be available for urgent issues outside of office hours, we are not available 24/7.   If you have an urgent issue and are unable to reach [place], you may choose to seek medical care at your doctor's office, retail clinic, urgent care center, or emergency room.  If you have a medical emergency, please immediately call 911 or go to the emergency department.  Pager Numbers  - Dr. Nehemiah Massed: [phone]  - Dr. Laurence Ferrari: [phone]  - Dr. Nicole Kindred: [phone]  In the event of inclement weather, please call our main line at [phone] for an update on the status of any delays or closures.  Dermatology Medication Tips: Please keep the boxes that topical medications come in in order to help keep track of the instructions about where and how to use these. Pharmacies typically print the medication instructions only on the boxes and not directly on the medication tubes.   If your medication is too expensive, please contact our office at [phone] option 4 or send [place] a message through [place].   We are unable to tell what your co-pay for medications will be in advance as this is different depending on your insurance coverage. However, we may be able to find a substitute medication at lower cost or fill out paperwork to get insurance to cover a needed medication.   If a prior authorization is required to get your medication covered by your insurance company, please allow [place] 1-2 business days to complete this process.  Drug prices often vary depending on where the prescription is filled and some pharmacies may offer cheaper prices.  The website www.goodrx.com contains coupons for medications through different pharmacies. The prices here do not account for what the cost may be with help from insurance (it may be cheaper with your insurance), but the website can give you the price if you did not use any insurance.   - You can print the associated coupon and take it with your prescription to the pharmacy.  - You may also stop by our office during regular business hours and pick up a GoodRx coupon card.  - If you need your prescription sent electronically to a different pharmacy, notify our office through [facility] or by phone at [phone] option 4.     Si Usted Necesita Algo Despus de Su Visita  Tambin puede enviarnos un mensaje a travs de Pharmacist, community. Por lo general respondemos a los mensajes de MyChart en el transcurso de 1 a 2 das hbiles.  Para renovar recetas, por favor pida a su farmacia que se ponga en contacto con nuestra oficina. Harland Dingwall de fax es [place] [phone].  Si tiene un asunto urgente cuando la clnica est cerrada y que no puede esperar hasta el siguiente da hbil, puede llamar/localizar a su doctor(a) al nmero que aparece a continuacin.   Por favor, tenga en cuenta que  aunque hacemos todo lo posible para estar disponibles para asuntos urgentes fuera del horario de oficina, no estamos disponibles las 24 horas del da, los 7 das de la [place].   Si tiene un problema urgente y no puede comunicarse con nosotros, puede optar por buscar atencin mdica  en el consultorio de su doctor(a), en una clnica privada, en un centro de atencin urgente o en una sala de emergencias.  Si tiene Engineering geologist, por favor llame inmediatamente al 911 o vaya a la sala de emergencias.  Nmeros de bper  - Dr. Nehemiah Massed: [phone]  - Dra. Moye: [phone]  - Dra. Nicole Kindred: [phone]  En caso de inclemencias del [place], por favor llame a Johnsie Kindred principal al [phone] para una actualizacin sobre el [place] de cualquier retraso o cierre.  Consejos para la medicacin en dermatologa: Por favor, guarde las cajas en las que vienen los medicamentos de uso tpico para ayudarle a seguir las instrucciones sobre dnde y cmo usarlos. Las farmacias generalmente  imprimen las instrucciones del medicamento slo en las cajas y no directamente en los tubos del [place].   Si su medicamento es muy caro, por favor, pngase en contacto con Zigmund Daniel llamando al [phone] y presione la opcin 4 o envenos un mensaje a travs de Pharmacist, community.   No podemos decirle cul ser su copago por los medicamentos por adelantado ya que esto es diferente dependiendo de la cobertura de su seguro. Sin embargo, es posible que podamos encontrar un medicamento sustituto a Electrical engineer un formulario para que el seguro cubra el medicamento que se considera necesario.   Si se requiere una autorizacin previa para que su compaa de seguros [place] su medicamento, por favor permtanos de 1 a 2 das hbiles para completar este proceso.  Los precios de los medicamentos varan con frecuencia dependiendo del Environmental consultant de dnde se surte la receta y alguna farmacias pueden ofrecer precios ms baratos.  El sitio web www.goodrx.com tiene cupones para medicamentos de Airline pilot. Los precios aqu no tienen en cuenta lo que podra costar con la ayuda del seguro (puede ser ms barato con su seguro), pero el sitio web puede darle el precio si no utiliz Research scientist (physical sciences).  - Puede imprimir el cupn correspondiente y llevarlo con su receta a la farmacia.  - Tambin puede pasar por nuestra oficina durante el horario de atencin regular y Charity fundraiser una tarjeta de cupones de GoodRx.  - Si necesita que su receta se enve electrnicamente a una farmacia diferente, informe a nuestra oficina a travs de MyChart de [place] o por telfono llamando al [phone] y presione la opcin 4.

## 2021-09-20 ENCOUNTER — Encounter: Payer: Self-pay | Admitting: Dermatology

## 2021-09-27 ENCOUNTER — Other Ambulatory Visit: Payer: Self-pay | Admitting: Internal Medicine

## 2021-09-27 ENCOUNTER — Other Ambulatory Visit: Payer: Self-pay

## 2021-09-27 ENCOUNTER — Ambulatory Visit
Admission: RE | Admit: 2021-09-27 | Discharge: 2021-09-27 | Disposition: A | Discharge status: Home / Self Care | Payer: BC Managed Care – PPO | Source: Ambulatory Visit | Attending: Internal Medicine | Admitting: Internal Medicine

## 2021-09-27 DIAGNOSIS — N6320 Unspecified lump in the left breast, unspecified quadrant: Secondary | ICD-10-CM | POA: Diagnosis not present

## 2021-12-31 ENCOUNTER — Ambulatory Visit: Payer: BC Managed Care – PPO | Admitting: Dermatology

## 2022-03-07 ENCOUNTER — Other Ambulatory Visit: Payer: Self-pay | Admitting: Internal Medicine

## 2022-03-07 DIAGNOSIS — N6320 Unspecified lump in the left breast, unspecified quadrant: Secondary | ICD-10-CM

## 2022-04-24 ENCOUNTER — Emergency Department: Payer: BC Managed Care – PPO

## 2022-04-24 ENCOUNTER — Emergency Department
Admission: EM | Admit: 2022-04-24 | Discharge: 2022-04-24 | Disposition: A | Discharge status: Home / Self Care | Payer: BC Managed Care – PPO | Attending: Emergency Medicine | Admitting: Emergency Medicine

## 2022-04-24 ENCOUNTER — Encounter: Payer: Self-pay | Admitting: Emergency Medicine

## 2022-04-24 ENCOUNTER — Other Ambulatory Visit: Payer: Self-pay

## 2022-04-24 DIAGNOSIS — M62838 Other muscle spasm: Secondary | ICD-10-CM | POA: Insufficient documentation

## 2022-04-24 DIAGNOSIS — I10 Essential (primary) hypertension: Secondary | ICD-10-CM | POA: Insufficient documentation

## 2022-04-24 DIAGNOSIS — M546 Pain in thoracic spine: Secondary | ICD-10-CM | POA: Insufficient documentation

## 2022-04-24 DIAGNOSIS — Z1152 Encounter for screening for COVID-19: Secondary | ICD-10-CM | POA: Insufficient documentation

## 2022-04-24 LAB — CBC WITH DIFFERENTIAL/PLATELET
Abs Immature Granulocytes: 0.03 10*3/uL (ref 0.00–0.07)
Basophils Absolute: 0.1 10*3/uL (ref 0.0–0.1)
Basophils Relative: 1 %
Eosinophils Absolute: 0.1 10*3/uL (ref 0.0–0.5)
Eosinophils Relative: 1 %
HCT: 36.6 % (ref 36.0–46.0)
Hemoglobin: 11.5 g/dL — ABNORMAL LOW (ref 12.0–15.0)
Immature Granulocytes: 0 %
Lymphocytes Relative: 19 %
Lymphs Abs: 1.4 10*3/uL (ref 0.7–4.0)
MCH: 26.8 pg (ref 26.0–34.0)
MCHC: 31.4 g/dL (ref 30.0–36.0)
MCV: 85.3 fL (ref 80.0–100.0)
Monocytes Absolute: 1 10*3/uL (ref 0.1–1.0)
Monocytes Relative: 14 %
Neutro Abs: 4.7 10*3/uL (ref 1.7–7.7)
Neutrophils Relative %: 65 %
Platelets: 275 10*3/uL (ref 150–400)
RBC: 4.29 MIL/uL (ref 3.87–5.11)
RDW: 14.5 % (ref 11.5–15.5)
WBC: 7.2 10*3/uL (ref 4.0–10.5)
nRBC: 0 % (ref 0.0–0.2)

## 2022-04-24 LAB — COMPREHENSIVE METABOLIC PANEL
ALT: 14 U/L (ref 0–44)
AST: 17 U/L (ref 15–41)
Albumin: 3.6 g/dL (ref 3.5–5.0)
Alkaline Phosphatase: 64 U/L (ref 38–126)
Anion gap: 7 (ref 5–15)
BUN: 13 mg/dL (ref 6–20)
CO2: 25 mmol/L (ref 22–32)
Calcium: 8.5 mg/dL — ABNORMAL LOW (ref 8.9–10.3)
Chloride: 104 mmol/L (ref 98–111)
Creatinine, Ser: 0.71 mg/dL (ref 0.44–1.00)
GFR, Estimated: >60 mL/min (ref >60)
Glucose, Bld: 100 mg/dL — ABNORMAL HIGH (ref 70–99)
Potassium: 3.7 mmol/L (ref 3.5–5.1)
Sodium: 136 mmol/L (ref 135–145)
Total Bilirubin: 0.4 mg/dL (ref 0.3–1.2)
Total Protein: 7 g/dL (ref 6.5–8.1)

## 2022-04-24 LAB — URINALYSIS, ROUTINE W REFLEX MICROSCOPIC
Bilirubin Urine: NEGATIVE
Glucose, UA: NEGATIVE mg/dL
Ketones, ur: NEGATIVE mg/dL
Leukocytes,Ua: NEGATIVE
Nitrite: NEGATIVE
Protein, ur: NEGATIVE mg/dL
Specific Gravity, Urine: 1.014 (ref 1.005–1.030)
pH: 7 (ref 5.0–8.0)

## 2022-04-24 LAB — POC URINE PREG, ED: Preg Test, Ur: NEGATIVE

## 2022-04-24 LAB — SARS CORONAVIRUS 2 BY RT PCR: SARS Coronavirus 2 by RT PCR: NEGATIVE

## 2022-04-24 MED ORDER — CYCLOBENZAPRINE HCL 5 MG PO TABS: 5.0000 mg | ORAL_TABLET | Freq: Three times a day (TID) | ORAL | 0 refills | Status: AC | PRN

## 2022-04-24 MED ORDER — CYCLOBENZAPRINE HCL 10 MG PO TABS
5.0000 mg | ORAL_TABLET | Freq: Once | ORAL | Status: AC
  Administered 2022-04-24: 5 mg via ORAL
  Filled 2022-04-24: qty 1

## 2022-04-24 MED ORDER — LIDOCAINE 5 % EX PTCH: 1.0000 | MEDICATED_PATCH | CUTANEOUS | 0 refills | Status: AC

## 2022-04-24 MED ORDER — KETOROLAC TROMETHAMINE 15 MG/ML IJ SOLN
15.0000 mg | Freq: Once | INTRAMUSCULAR | Status: AC
  Administered 2022-04-24: 15 mg via INTRAMUSCULAR
  Filled 2022-04-24: qty 1

## 2022-04-24 NOTE — ED Triage Notes (Signed)
Patient ambulatory to triage with steady gait, without difficulty or distress noted; pt reports rt flank pain since Monday accomp by urinary frequency; also reports recent sinus congestion; neg COVID test yesterday

## 2022-04-24 NOTE — ED Provider Notes (Signed)
[facility] Provider Note    Event Date/Time   First MD Initiated Contact with Patient 04/24/22 [phone]     (approximate)   History   Flank Pain   HPI  Christine Abbott is a 46 y.o. female past medical history of anxiety, hypertension who presents with flank pain/back pain.  Patient notes that she started developing cold symptoms on Monday, 3 days ago.  She endorses scratchy throat congestion and cough.  Then started having some back/flank pain.  Back pain is primarily in the right mid back does not radiate around to the abdomen.  Pain comes in spasms.  It is worse with movement.  Denies urinary symptoms.  Does have history of frequent UTIs typically gets pressure and burning but she is not feeling that currently.  She has tried Tylenol Motrin which have not touched it.  Went to urgent care yesterday had UA that was clean negative viral testing was told this is likely viral illness.     Past Medical History:  Diagnosis Date   Advanced maternal age in multigravida 2017   Anemia    Anxiety    Dysplastic nevus 09/15/2006   L mid med scapula - mild   Dysplastic nevus 09/15/2006   R axilla - mild to mod   Dysplastic nevus 09/15/2006   R prox forearm proximal - mod   Dysplastic nevus 09/15/2006   R prox forearm distal - mild to mod   Dysplastic nevus 08/23/2008   L mid med thigh - mild   Dysplastic nevus 08/23/2008   R mid ant thigh - mild   Dysplastic nevus 08/23/2008   RLQA suprapubic - mild   Dysplastic nevus    UTI (urinary tract infection)     Patient Active Problem List   Diagnosis Date Noted   Iron deficiency anemia 02/04/2020   HTN, goal below 140/80 10/14/2019   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   Sepsis ([place]) 09/25/2017   Anxiety 11/01/2016   Cesarean delivery delivered 05/24/2016   Post-operative state 05/24/2016   History of abdominoplasty 11/19/2015   Advanced maternal age in multigravida, first trimester 11/06/2015   Mild obesity 05/29/2015    Chronic cystitis 07/06/2012   Microscopic hematuria 07/06/2012   Mixed urge and stress incontinence 07/06/2012   Symptoms involving urinary system 07/06/2012   Vesicoureteral reflux 07/06/2012     Physical Exam  Triage Vital Signs: ED Triage Vitals  Enc Vitals Group     BP 04/24/22 0428 (!) 156/95     Pulse Rate 04/24/22 0428 81     Resp 04/24/22 0428 18     Temp 04/24/22 0428 98.3 F (36.8 C)     Temp Source 04/24/22 0428 Oral     SpO2 04/24/22 0428 93 %     Weight 04/24/22 0427 145 lb (65.8 kg)     Height 04/24/22 0427 '5\' 2"'$  (1.575 m)     Head Circumference --      Peak Flow --      Pain Score 04/24/22 0427 10     Pain Loc --      Pain Edu? --      Excl. in [place]? --     Most recent vital signs: Vitals:   04/24/22 0428  BP: (!) 156/95  Pulse: 81  Resp: 18  Temp: 98.3 F (36.8 C)  SpO2: 93%     General: Awake, no distress.  CV:  Good peripheral perfusion.  Resp:  Normal effort.  Abd:  No distention.  Tender Neuro:             Awake, Alert, Oriented x 3  Other:  Mild tenderness palpation over the right paraspinal musculature in the thoracic region and CVA tenderness to palpation no overlying skin changes no midline lumbar tenderness   ED Results / Procedures / Treatments  Labs (all labs ordered are listed, but only abnormal results are displayed) Labs Reviewed  CBC WITH DIFFERENTIAL/PLATELET - Abnormal; Notable for the following components:      Result Value   Hemoglobin 11.5 (*)    All other components within normal limits  COMPREHENSIVE METABOLIC PANEL - Abnormal; Notable for the following components:   Glucose, Bld 100 (*)    Calcium 8.5 (*)    All other components within normal limits  URINALYSIS, ROUTINE W REFLEX MICROSCOPIC - Abnormal; Notable for the following components:   Color, Urine YELLOW (*)    APPearance HAZY (*)    Hgb urine dipstick SMALL (*)    Bacteria, UA MANY (*)    All other components within normal limits  SARS CORONAVIRUS 2 BY RT  PCR  POC URINE PREG, ED     EKG     RADIOLOGY CT renal study reviewed interpreted myself is negative for acute findings  PROCEDURES:  Critical Care performed: No  Procedures   MEDICATIONS ORDERED IN ED: Medications  ketorolac (TORADOL) 15 MG/ML injection 15 mg (15 mg Intramuscular Given 04/24/22 0531)  cyclobenzaprine (FLEXERIL) tablet 5 mg (5 mg Oral Given 04/24/22 0530)     IMPRESSION / MDM / ASSESSMENT AND PLAN / ED COURSE  I reviewed the triage vital signs and the nursing notes.                              Patient's presentation is most consistent with acute complicated illness / injury requiring diagnostic workup.  Differential diagnosis includes, but is not limited to, kidney stone, pyelonephritis, musculoskeletal pain, viral illness causing myalgias  Patient is a 46 year old female who presents with right back/flank pain for the last 2 days.  Pain feels like muscle spasm is worse with movement.  She is also had viral symptoms including scratchy throat chills myalgias.  No history of kidney stones.  On exam she looks well.  She does have some CVA tenderness and paraspinal thoracic tenderness no midline lumbar thoracic tenderness.  My suspicion is that this is musculoskeletal.  However will obtain CT renal study to rule out stone.  UA is not consistent with infection.  Will treat with Toradol and Flexeril for muscle spasm.  Labs including CBC and CMP are reassuring.  COVID test is negative.  CT renal study does not have any acute findings.  Suspect musculoskeletal etiology of pain.  In addition to Tylenol Motrin will add Flexeril and Lidoderm patch.  Patient is appropriate for discharge.  Clinical Course as of 04/24/22 0559  Wed Apr 24, 2022  0448 Preg Test, Ur: Negative [KM]    Clinical Course User Index [KM] Rada Hay, MD     FINAL CLINICAL IMPRESSION(S) / ED DIAGNOSES   Final diagnoses:  Muscle spasm  Acute right-sided thoracic back pain     Rx  / DC Orders   ED Discharge Orders          Ordered    cyclobenzaprine (FLEXERIL) 5 MG tablet  3 times daily PRN        04/24/22 0559    lidocaine (LIDODERM)  5 %  Every 24 hours        04/24/22 0559             Note:  This document was prepared using Dragon voice recognition software and may include unintentional dictation errors.   Rada Hay, MD 04/24/22 [phone]

## 2022-04-24 NOTE — Discharge Instructions (Addendum)
Your CAT scan urine sample and blood work were all reassuring today.  I suspect that your pain is related to muscle spasm and this could be exacerbated by you having an underlying viral illness.  Your COVID test was negative.  In addition to taking Tylenol Motrin you can take the Flexeril as needed for severe pain.  This can make you sleepy so please do not drive when taking this medication.

## 2022-04-28 ENCOUNTER — Other Ambulatory Visit: Payer: Self-pay

## 2022-04-28 ENCOUNTER — Encounter: Payer: Self-pay | Admitting: Emergency Medicine

## 2022-04-28 ENCOUNTER — Emergency Department
Admission: EM | Admit: 2022-04-28 | Discharge: 2022-04-28 | Disposition: A | Discharge status: Home / Self Care | Payer: BC Managed Care – PPO | Attending: Emergency Medicine | Admitting: Emergency Medicine

## 2022-04-28 DIAGNOSIS — Z79899 Other long term (current) drug therapy: Secondary | ICD-10-CM | POA: Insufficient documentation

## 2022-04-28 DIAGNOSIS — M549 Dorsalgia, unspecified: Secondary | ICD-10-CM | POA: Diagnosis present

## 2022-04-28 DIAGNOSIS — I10 Essential (primary) hypertension: Secondary | ICD-10-CM | POA: Diagnosis not present

## 2022-04-28 DIAGNOSIS — M62830 Muscle spasm of back: Secondary | ICD-10-CM | POA: Insufficient documentation

## 2022-04-28 DIAGNOSIS — R8281 Pyuria: Secondary | ICD-10-CM | POA: Diagnosis not present

## 2022-04-28 LAB — COMPREHENSIVE METABOLIC PANEL
ALT: 19 U/L (ref 0–44)
AST: 20 U/L (ref 15–41)
Albumin: 3.5 g/dL (ref 3.5–5.0)
Alkaline Phosphatase: 62 U/L (ref 38–126)
Anion gap: 6 (ref 5–15)
BUN: 18 mg/dL (ref 6–20)
CO2: 26 mmol/L (ref 22–32)
Calcium: 9 mg/dL (ref 8.9–10.3)
Chloride: 105 mmol/L (ref 98–111)
Creatinine, Ser: 0.74 mg/dL (ref 0.44–1.00)
GFR, Estimated: >60 mL/min (ref >60)
Glucose, Bld: 100 mg/dL — ABNORMAL HIGH (ref 70–99)
Potassium: 4.2 mmol/L (ref 3.5–5.1)
Sodium: 137 mmol/L (ref 135–145)
Total Bilirubin: 0.6 mg/dL (ref 0.3–1.2)
Total Protein: 7.2 g/dL (ref 6.5–8.1)

## 2022-04-28 LAB — CBC
HCT: 38.7 % (ref 36.0–46.0)
Hemoglobin: 12.4 g/dL (ref 12.0–15.0)
MCH: 27.1 pg (ref 26.0–34.0)
MCHC: 32 g/dL (ref 30.0–36.0)
MCV: 84.7 fL (ref 80.0–100.0)
Platelets: 297 10*3/uL (ref 150–400)
RBC: 4.57 MIL/uL (ref 3.87–5.11)
RDW: 13.9 % (ref 11.5–15.5)
WBC: 6.7 10*3/uL (ref 4.0–10.5)
nRBC: 0 % (ref 0.0–0.2)

## 2022-04-28 LAB — URINALYSIS, ROUTINE W REFLEX MICROSCOPIC
Bilirubin Urine: NEGATIVE
Glucose, UA: NEGATIVE mg/dL
Ketones, ur: 5 mg/dL — AB
Leukocytes,Ua: NEGATIVE
Nitrite: NEGATIVE
Protein, ur: 30 mg/dL — AB
Specific Gravity, Urine: 1.023 (ref 1.005–1.030)
pH: 6 (ref 5.0–8.0)

## 2022-04-28 LAB — D-DIMER, QUANTITATIVE: D-Dimer, Quant: 0.27 ug/mL-FEU (ref 0.00–0.50)

## 2022-04-28 MED ORDER — FOSFOMYCIN TROMETHAMINE 3 G PO PACK
3.0000 g | PACK | Freq: Once | ORAL | Status: AC
  Administered 2022-04-28: 3 g via ORAL
  Filled 2022-04-28: qty 3

## 2022-04-28 MED ORDER — ONDANSETRON 4 MG PO TBDP: ORAL_TABLET | ORAL | 0 refills | Status: AC

## 2022-04-28 MED ORDER — HYDROCODONE-ACETAMINOPHEN 5-325 MG PO TABS: 2.0000 | ORAL_TABLET | Freq: Four times a day (QID) | ORAL | 0 refills | Status: AC | PRN

## 2022-04-28 MED ORDER — IBUPROFEN 600 MG PO TABS
600.0000 mg | ORAL_TABLET | Freq: Once | ORAL | Status: AC
  Administered 2022-04-28: 600 mg via ORAL
  Filled 2022-04-28: qty 1

## 2022-04-28 MED ORDER — DIAZEPAM 5 MG PO TABS
5.0000 mg | ORAL_TABLET | Freq: Once | ORAL | Status: AC
  Administered 2022-04-28: 5 mg via ORAL
  Filled 2022-04-28: qty 1

## 2022-04-28 MED ORDER — OXYCODONE-ACETAMINOPHEN 5-325 MG PO TABS
1.0000 | ORAL_TABLET | Freq: Once | ORAL | Status: DC
  Filled 2022-04-28: qty 1

## 2022-04-28 MED ORDER — HYDROCODONE-ACETAMINOPHEN 5-325 MG PO TABS
1.0000 | ORAL_TABLET | Freq: Once | ORAL | Status: AC
  Administered 2022-04-28: 1 via ORAL
  Filled 2022-04-28: qty 1

## 2022-04-28 MED ORDER — DOCUSATE SODIUM 100 MG PO CAPS: ORAL_CAPSULE | ORAL | 0 refills | Status: AC

## 2022-04-28 NOTE — ED Provider Notes (Addendum)
[facility] Provider Note    Event Date/Time   First MD Initiated Contact with Patient 04/28/22 [id number]     (approximate)   History   Back Pain and Flank Pain   HPI  Christine Abbott is a 46 y.o. female who presents for evaluation of left-sided back pain.  She describes it as being a spasm that is very severe and painful and occurs every couple of minutes.  This been going on for almost a week.  She came into the emergency department about 4 days ago for the same issue except at the time it was happening on her right side.  Before that she was at an urgent care and was having some viral symptoms such as nasal congestion and runny nose but she was not having any shortness of breath or cough.  She has a history of frequent UTIs so she was concerned she may have a urinary tract infection or kidney infection but her evaluation was generally reassuring and she had a normal CT scan of time that showed no evidence of stones.  She was diagnosed with some back spasms and was prescribed cyclobenzaprine.  However she said that the Toradol injection she got a few days ago helped for a while but the cyclobenzaprine does not seem to do anything.  The pain has moved away from her right back/flank and is now occurring on the left side.  She said it is affecting her ability to sleep and she cannot get comfortable.  She denies fever, chest pain, shortness of breath, cough, vomiting, history of blood clots in the legs or lungs, recent immobilization, or recent surgeries.     Physical Exam   Triage Vital Signs: ED Triage Vitals  Enc Vitals Group     BP 04/28/22 0135 (!) 161/114     Pulse Rate 04/28/22 0135 74     Resp 04/28/22 0135 16     Temp 04/28/22 0135 98 F (36.7 C)     Temp Source 04/28/22 0135 Oral     SpO2 04/28/22 0135 99 %     Weight 04/28/22 0136 65.8 kg (145 lb)     Height 04/28/22 0136 1.575 m ('5\' 2"'$ )     Head Circumference --      Peak Flow --      Pain Score  04/28/22 0136 8     Pain Loc --      Pain Edu? --      Excl. in [place]? --     Most recent vital signs: Vitals:   04/28/22 0330 04/28/22 0430  BP: (!) 147/95 (!) 142/95  Pulse: 69 61  Resp: 16 14  Temp:    SpO2: 98% 97%     General: Awake, no distress.  CV:  Good peripheral perfusion.  Normal heart sounds. Resp:  Normal effort.  Lungs are clear to auscultation. Abd:  No distention.  No tenderness palpation of the abdomen.  No flank tenderness to percussion. Other:  Mood and affect are normal and appropriate.   ED Results / Procedures / Treatments   Labs (all labs ordered are listed, but only abnormal results are displayed) Labs Reviewed  COMPREHENSIVE METABOLIC PANEL - Abnormal; Notable for the following components:      Result Value   Glucose, Bld 100 (*)    All other components within normal limits  URINALYSIS, ROUTINE W REFLEX MICROSCOPIC - Abnormal; Notable for the following components:   Color, Urine YELLOW (*)  APPearance CLOUDY (*)    Hgb urine dipstick SMALL (*)    Ketones, ur 5 (*)    Protein, ur 30 (*)    Bacteria, UA RARE (*)    All other components within normal limits  URINE CULTURE  CBC  D-DIMER, QUANTITATIVE     EKG  ED ECG REPORT I, Hinda Kehr, the attending physician, personally viewed and interpreted this ECG.  Date: 04/28/2022 EKG Time: 4:15 AM Rate: 64 Rhythm: normal sinus rhythm QRS Axis: normal Intervals: normal ST/T Wave abnormalities: normal Narrative Interpretation: no evidence of acute ischemia   PROCEDURES:  Critical Care performed: No  .1-3 Lead EKG Interpretation  Performed by: Hinda Kehr, MD Authorized by: Hinda Kehr, MD     Interpretation: normal     ECG rate:  68   ECG rate assessment: normal     Rhythm: sinus rhythm     Ectopy: none     Conduction: normal      MEDICATIONS ORDERED IN ED: Medications  fosfomycin (MONUROL) packet 3 g (has no administration in time range)  HYDROcodone-acetaminophen  (NORCO/VICODIN) 5-325 MG per tablet 1 tablet (has no administration in time range)  diazepam (VALIUM) tablet 5 mg (5 mg Oral Given 04/28/22 0402)  ibuprofen (ADVIL) tablet 600 mg (600 mg Oral Given 04/28/22 0402)     IMPRESSION / MDM / ASSESSMENT AND PLAN / ED COURSE  I reviewed the triage vital signs and the nursing notes.                              Differential diagnosis includes, but is not limited to, musculoskeletal strain/spasm, contusion or trauma, UTI/pylonephritis, PE, radicular pain, transverse myelitis, osteomyelitis/discitis.  Patient's presentation is most consistent with acute presentation with potential threat to life or bodily function.  Labs/studies ordered include CBC, comprehensive metabolic panel, urinalysis, D-dimer, urine culture.  I also ordered an EKG as there is not one on record since 2019 and this could be an atypical ACS presentation even though it is unlikely.  Vital signs are stable and within normal limits other than hypertension which has come down since she has been here.  She has a diagnosis of hypertension and takes medication.  I reviewed her prior visit and I viewed and interpreted her CT renal stone protocol.  I can see no evidence of inflammation or infection of the kidneys and there is no evidence of renal or ureteral stones.  Her labs tonight are essentially normal.  She said that she gets frequent UTIs and tonight she has rare bacteria and 21-50 WBCs but negative leukocytes and she is nitrite negative.  I ordered a urine culture this time (I do not see one on record) we will have to consider whether or not she would benefit from empiric treatment.  Of note, she has more WBCs seen tonight, but before there were "many" bacteria but now is only rare.  A potential cause of her pain in the lower thoracic/upper lumbar region of her back essentially be the.  Even though her vital signs are stable and she is at low risk, I think it is reasonable to get a  D-dimer to provide reassurance that this is not scattered lower lobe PEs.  I discussed this possibility with the patient and she agrees with this plan.  She knows we will proceed with CTA chest if the D-dimer is elevated.  To treat her symptoms I offered her either opioids or benzodiazepines.  She says she does not usually like to do strong pain medicine so I ordered diazepam 5 mg p.o. and ibuprofen 600 mg p.o. and will reassess.  The patient is on the cardiac monitor to evaluate for evidence of arrhythmia and/or significant heart rate changes.  Clinical Course as of 04/28/22 0554  [facility] Apr 28, 2022  0540 D-dimer, quantitative D-dimer is within normal limits.  No need to proceed with imaging.  I reassessed the patient.  She feels a little bit sleepy after the diazepam but says she still feels the pain.  I agreed to give her a Percocet for now, but cautioned her against too much sedating medications.  However given that she is not currently particular somnolent, it should not be an issue.  I explained that there is no evidence of an emergent medical condition at this time and no need for admission.  I think she is having muscle spasms and musculoskeletal strain which will need to work out over time.  I suggested ibuprofen 600 mg 3 times a day with meals and I wrote a prescription for a few Norco as well as Colace to try to prevent constipation.  She has a PCP (Dr. Doy Hutching) with whom she can follow-up closely.  I told her about the urine culture that he can follow-up on as well and I gave my usual and customary return precautions.  She and her mother understand and agree with the plan. [CF]    Clinical Course User Index [CF] Hinda Kehr, MD     FINAL CLINICAL IMPRESSION(S) / ED DIAGNOSES   Final diagnoses:  Spasm of back muscles  Pyuria     Rx / DC Orders   ED Discharge Orders          Ordered    HYDROcodone-acetaminophen (NORCO/VICODIN) 5-325 MG tablet  Every 6 hours PRN         04/28/22 0532    docusate sodium (COLACE) 100 MG capsule        04/28/22 0532    ondansetron (ZOFRAN-ODT) 4 MG disintegrating tablet        04/28/22 0541             Note:  This document was prepared using Dragon voice recognition software and may include unintentional dictation errors.   Hinda Kehr, MD 04/28/22 0973    Hinda Kehr, MD 04/28/22 5329    Hinda Kehr, MD 04/28/22 [phone]

## 2022-04-28 NOTE — Discharge Instructions (Addendum)
As we discussed, your evaluation was again reassuring, and we do not have a specific explanation for the pain in your back.  It is most likely musculoskeletal with a strain and spasming of your back muscles.  Please read through the included information.  Since the Flexeril (cyclobenzaprine) was not working for you, we wrote a short course of Norco that may be able to help with your discomfort.  We recommend that you take ibuprofen 600 mg 3 times a day with meals as well.  Please know that taking the Norco along with your regular dose of alprazolam may increase the sleepiness that is caused by the medications and you should do so with caution.  Certainly do not drive or operate machinery when taking the medications.  Follow-up with Dr. Doy Hutching at the next available opportunity.  Return to the emergency department if you develop new or worsening symptoms that concern you.  Please let Dr. Doy Hutching know that we gave you a one-time dose of an antibiotic called fosfomycin that is good for mild UTIs, and we sent your urine to the lab for a culture; he should be able to follow up on the results to see if you had a "real" UTI and if you need additional treatment.

## 2022-04-28 NOTE — ED Notes (Signed)
Pt brought to ED rm 26 at this time, this RN now assuming care. 

## 2022-04-28 NOTE — ED Triage Notes (Signed)
Pt ambulatory to triage with steady gait, without difficulty or distress noted. Pt reports she was seen in ER on Tuesday for right back pain and flank pain. Pt discharged home with some muscle relaxant medication. Pt reports today pain is located to left side. In triage noted pt's BP elevated, Pt denies any urinary symptoms pt talks in complete sentences no distress noted.

## 2022-04-28 NOTE — ED Notes (Signed)
ED Provider at bedside. 

## 2022-04-30 LAB — URINE CULTURE: Culture: >=100000 — AB

## 2022-05-06 ENCOUNTER — Ambulatory Visit
Admission: RE | Admit: 2022-05-06 | Discharge: 2022-05-06 | Disposition: A | Discharge status: Home / Self Care | Payer: BC Managed Care – PPO | Source: Ambulatory Visit | Attending: Internal Medicine | Admitting: Internal Medicine

## 2022-05-06 DIAGNOSIS — N6320 Unspecified lump in the left breast, unspecified quadrant: Secondary | ICD-10-CM | POA: Insufficient documentation

## 2022-10-09 ENCOUNTER — Ambulatory Visit: Payer: BC Managed Care – PPO | Admitting: Dermatology

## 2022-10-09 ENCOUNTER — Encounter: Payer: Self-pay | Admitting: Dermatology

## 2022-10-09 ENCOUNTER — Ambulatory Visit (INDEPENDENT_AMBULATORY_CARE_PROVIDER_SITE_OTHER): Payer: BC Managed Care – PPO | Admitting: Dermatology

## 2022-10-09 VITALS — BP 163/111 | HR 76

## 2022-10-09 DIAGNOSIS — L578 Other skin changes due to chronic exposure to nonionizing radiation: Secondary | ICD-10-CM

## 2022-10-09 DIAGNOSIS — D229 Melanocytic nevi, unspecified: Secondary | ICD-10-CM

## 2022-10-09 DIAGNOSIS — L738 Other specified follicular disorders: Secondary | ICD-10-CM

## 2022-10-09 DIAGNOSIS — Z86018 Personal history of other benign neoplasm: Secondary | ICD-10-CM

## 2022-10-09 DIAGNOSIS — Z1283 Encounter for screening for malignant neoplasm of skin: Secondary | ICD-10-CM | POA: Diagnosis not present

## 2022-10-09 DIAGNOSIS — D225 Melanocytic nevi of trunk: Secondary | ICD-10-CM

## 2022-10-09 DIAGNOSIS — L820 Inflamed seborrheic keratosis: Secondary | ICD-10-CM | POA: Diagnosis not present

## 2022-10-09 DIAGNOSIS — D1801 Hemangioma of skin and subcutaneous tissue: Secondary | ICD-10-CM

## 2022-10-09 DIAGNOSIS — B353 Tinea pedis: Secondary | ICD-10-CM | POA: Diagnosis not present

## 2022-10-09 DIAGNOSIS — L300 Nummular dermatitis: Secondary | ICD-10-CM | POA: Diagnosis not present

## 2022-10-09 DIAGNOSIS — L821 Other seborrheic keratosis: Secondary | ICD-10-CM

## 2022-10-09 DIAGNOSIS — L814 Other melanin hyperpigmentation: Secondary | ICD-10-CM

## 2022-10-09 MED ORDER — TRIAMCINOLONE ACETONIDE 0.1 % EX CREA: TOPICAL_CREAM | CUTANEOUS | 1 refills | Status: AC

## 2022-10-09 MED ORDER — CICLOPIROX OLAMINE 0.77 % EX CREA: TOPICAL_CREAM | CUTANEOUS | 1 refills | Status: AC

## 2022-10-09 NOTE — Patient Instructions (Addendum)
For Dermatitis at back  Apply triamcinolone 0.1% cream twice a day as needed to affected areas. Avoid applying to face, groin, and axilla. Use as directed. Long-term use can cause thinning of the skin.  Topical steroids (such as triamcinolone, fluocinolone, fluocinonide, mometasone, clobetasol, halobetasol, betamethasone, hydrocortisone) can cause thinning and lightening of the skin if they are used for too long in the same area. Your physician has selected the right strength medicine for your problem and area affected on the body. Please use your medication only as directed by your physician to prevent side effects.   For Tinea pedis at toes  Start ciclopirox cream - apply twice daily to toes until clear.      Seborrheic Keratosis  What causes seborrheic keratoses? Seborrheic keratoses are harmless, common skin growths that first appear during adult life.  As time goes by, more growths appear.  Some people may develop a large number of them.  Seborrheic keratoses appear on both covered and uncovered body parts.  They are not caused by sunlight.  The tendency to develop seborrheic keratoses can be inherited.  They vary in color from skin-colored to gray, brown, or even black.  They can be either smooth or have a rough, warty surface.   Seborrheic keratoses are superficial and look as if they were stuck on the skin.  Under the microscope this type of keratosis looks like layers upon layers of skin.  That is why at times the top layer may seem to fall off, but the rest of the growth remains and re-grows.    Treatment Seborrheic keratoses do not need to be treated, but can easily be removed in the office.  Seborrheic keratoses often cause symptoms when they rub on clothing or jewelry.  Lesions can be in the way of shaving.  If they become inflamed, they can cause itching, soreness, or burning.  Removal of a seborrheic keratosis can be accomplished by freezing, burning, or surgery. If any spot  bleeds, scabs, or grows rapidly, please return to have it checked, as these can be an indication of a skin cancer.   Cryotherapy Aftercare  Wash gently with soap and water everyday.   Apply Vaseline and Band-Aid daily until healed.      Melanoma ABCDEs  Melanoma is the most dangerous type of skin cancer, and is the leading cause of death from skin disease.  You are more likely to develop melanoma if you: Have light-colored skin, light-colored eyes, or red or blond hair Spend a lot of time in the sun Tan regularly, either outdoors or in a tanning bed Have had blistering sunburns, especially during childhood Have a close family member who has had a melanoma Have atypical moles or large birthmarks  Early detection of melanoma is key since treatment is typically straightforward and cure rates are extremely high if we catch it early.   The first sign of melanoma is often a change in a mole or a new dark spot.  The ABCDE system is a way of remembering the signs of melanoma.  A for asymmetry:  The two halves do not match. B for border:  The edges of the growth are irregular. C for color:  A mixture of colors are present instead of an even brown color. D for diameter:  Melanomas are usually (but not always) greater than 59mm - the size of a pencil eraser. E for evolution:  The spot keeps changing in size, shape, and color.  Please check your skin once  per month between visits. You can use a small mirror in front and a large mirror behind you to keep an eye on the back side or your body.   If you see any new or changing lesions before your next follow-up, please call to schedule a visit.  Please continue daily skin protection including broad spectrum sunscreen SPF 30+ to sun-exposed areas, reapplying every 2 hours as needed when you're outdoors.   Staying in the shade or wearing long sleeves, sun glasses (UVA+UVB protection) and wide brim hats (4-inch brim around the entire circumference  of the hat) are also recommended for sun protection.     Due to recent changes in healthcare laws, you may see results of your pathology and/or laboratory studies on MyChart before the doctors have had a chance to review them. We understand that in some cases there may be results that are confusing or concerning to you. Please understand that not all results are received at the same time and often the doctors may need to interpret multiple results in order to provide you with the best plan of care or course of treatment. Therefore, we ask that you please give [place] 2 business days to thoroughly review all your results before contacting the office for clarification. Should we see a critical lab result, you will be contacted sooner.   If You Need Anything After Your Visit  If you have any questions or concerns for your doctor, please call our main line at [phone] and press option 4 to reach your doctor's medical assistant. If no one answers, please leave a voicemail as directed and we will return your call as soon as possible. Messages left after 4 pm will be answered the following business day.   You may also send [place] a message via [place]. We typically respond to MyChart messages within 1-2 business days.  For prescription refills, please ask your pharmacy to contact our office. Our fax number is [phone].  If you have an urgent issue when the clinic is closed that cannot wait until the next business day, you can page your doctor at the number below.    Please note that while we do our best to be available for urgent issues outside of office hours, we are not available 24/7.   If you have an urgent issue and are unable to reach [place], you may choose to seek medical care at your doctor's office, retail clinic, urgent care center, or emergency room.  If you have a medical emergency, please immediately call 911 or go to the emergency department.  Pager Numbers  - Dr. Nehemiah Massed:  [phone]  - Dr. Laurence Ferrari: [phone]  - Dr. Nicole Kindred: [phone]  In the event of inclement weather, please call our main line at [phone] for an update on the status of any delays or closures.  Dermatology Medication Tips: Please keep the boxes that topical medications come in in order to help keep track of the instructions about where and how to use these. Pharmacies typically print the medication instructions only on the boxes and not directly on the medication tubes.   If your medication is too expensive, please contact our office at [phone] option 4 or send [place] a message through [place].   We are unable to tell what your co-pay for medications will be in advance as this is different depending on your insurance coverage. However, we may be able to find a substitute medication at lower cost or fill out paperwork to get insurance to  cover a needed medication.   If a prior authorization is required to get your medication covered by your insurance company, please allow [place] 1-2 business days to complete this process.  Drug prices often vary depending on where the prescription is filled and some pharmacies may offer cheaper prices.  The website www.goodrx.com contains coupons for medications through different pharmacies. The prices here do not account for what the cost may be with help from insurance (it may be cheaper with your insurance), but the website can give you the price if you did not use any insurance.  - You can print the associated coupon and take it with your prescription to the pharmacy.  - You may also stop by our office during regular business hours and pick up a GoodRx coupon card.  - If you need your prescription sent electronically to a different pharmacy, notify our office through [facility] or by phone at [phone] option 4.     Si Usted Necesita Algo Despus de Su Visita  Tambin puede enviarnos un mensaje a travs de Pharmacist, community. Por lo general  respondemos a los mensajes de MyChart en el transcurso de 1 a 2 das hbiles.  Para renovar recetas, por favor pida a su farmacia que se ponga en contacto con nuestra oficina. Harland Dingwall de fax es [place] [phone].  Si tiene un asunto urgente cuando la clnica est cerrada y que no puede esperar hasta el siguiente da hbil, puede llamar/localizar a su doctor(a) al nmero que aparece a continuacin.   Por favor, tenga en cuenta que aunque hacemos todo lo posible para estar disponibles para asuntos urgentes fuera del horario de [place], no estamos disponibles las 24 horas del da, los 7 das de la [employer].   Si tiene un problema urgente y no puede comunicarse con nosotros, puede optar por buscar atencin mdica  en el consultorio de su doctor(a), en una clnica privada, en un centro de atencin urgente o en una sala de emergencias.  Si tiene Engineering geologist, por favor llame inmediatamente al 911 o vaya a la sala de emergencias.  Nmeros de bper  - Dr. Nehemiah Massed: [phone]  - Dra. Moye: [phone]  - Dra. Nicole Kindred: [phone]  En caso de inclemencias del [place], por favor llame a Johnsie Kindred principal al [phone] para una actualizacin sobre el [place] de cualquier retraso o cierre.  Consejos para la medicacin en dermatologa: Por favor, guarde las cajas en las que vienen los medicamentos de uso tpico para ayudarle a seguir las instrucciones sobre dnde y cmo usarlos. Las farmacias generalmente imprimen las instrucciones del medicamento slo en las cajas y no directamente en los tubos del [place].   Si su medicamento es muy caro, por favor, pngase en contacto con Zigmund Daniel llamando al [phone] y presione la opcin 4 o envenos un mensaje a travs de Pharmacist, community.   No podemos decirle cul ser su copago por los medicamentos por adelantado ya que esto es diferente dependiendo de la cobertura de su seguro. Sin embargo, es posible que podamos encontrar un  medicamento sustituto a Electrical engineer un formulario para que el seguro cubra el medicamento que se considera necesario.   Si se requiere una autorizacin previa para que su compaa de seguros [place] su medicamento, por favor permtanos de 1 a 2 das hbiles para completar este proceso.  Los precios de los medicamentos varan con frecuencia dependiendo del Environmental consultant de dnde se surte la receta y alguna farmacias pueden ofrecer precios ms  baratos.  El sitio web www.goodrx.com tiene cupones para medicamentos de Airline pilot. Los precios aqu no tienen en cuenta lo que podra costar con la ayuda del seguro (puede ser ms barato con su seguro), pero el sitio web puede darle el precio si no utiliz Research scientist (physical sciences).  - Puede imprimir el cupn correspondiente y llevarlo con su receta a la farmacia.  - Tambin puede pasar por nuestra oficina durante el horario de atencin regular y Charity fundraiser una tarjeta de cupones de GoodRx.  - Si necesita que su receta se enve electrnicamente a una farmacia diferente, informe a nuestra oficina a travs de MyChart de [place] o por telfono llamando al [phone] y presione la opcin 4.

## 2022-10-09 NOTE — Progress Notes (Signed)
Follow-Up Visit   Subjective  Christine Abbott is a 47 y.o. female who presents for the following: Skin Cancer Screening and Full Body Skin Exam Patient reports a new spot at abdomen that is painful, irritated by bra. Also another new spot at L groin.  Hx of dysplastic nevus  The patient presents for Total-Body Skin Exam (TBSE) for skin cancer screening and mole check. The patient has spots, moles and lesions to be evaluated, some may be new or changing and the patient has concerns that these could be cancer.    The following portions of the chart were reviewed this encounter and updated as appropriate: medications, allergies, medical history  Review of Systems:  No other skin or systemic complaints except as noted in HPI or Assessment and Plan.  Objective  Well appearing patient in no apparent distress; mood and affect are within normal limits.  A full examination was performed including scalp, head, eyes, ears, nose, lips, neck, chest, axillae, abdomen, back, buttocks, bilateral upper extremities, bilateral lower extremities, hands, feet, fingers, toes, fingernails, and toenails. All findings within normal limits unless otherwise noted below.   Tinea Pedis of both feet Exam:Pink scaly patch on left great toe .   Treatment: Start Ciclopirox cream bid to aa toes x 2-4 weeks  Sebaceous Hyperplasia - 2.40mm yellow white pap R malar cheek and forehead - Benign - Stable, observe   Lentigines, Seborrheic Keratoses, Hemangiomas - Benign normal skin lesions - Benign-appearing - Call for any changes  Melanocytic Nevi - Tan-brown and/or pink-flesh-colored symmetric macules and papules - Benign appearing on exam today - Observation - Call clinic for new or changing moles - Recommend daily use of broad spectrum spf 30+ sunscreen to sun-exposed areas.   Melanocytic Nevi 4 mm regular brown macule at left lateral breast  - Benign appearing on exam today -  Observation Benign-appearing. Stable compared to previous visit. Observation.  Call clinic for new or changing moles.  Recommend daily use of broad spectrum spf 30+ sunscreen to sun-exposed areas.   Actinic Damage - Chronic condition, secondary to cumulative UV/sun exposure - diffuse scaly erythematous macules with underlying dyspigmentation - Recommend daily broad spectrum sunscreen SPF 30+ to sun-exposed areas, reapply every 2 hours as needed.  - Staying in the shade or wearing long sleeves, sun glasses (UVA+UVB protection) and wide brim hats (4-inch brim around the entire circumference of the hat) are also recommended for sun protection.  - Call for new or changing lesions.  History of Dysplastic Nevi - No evidence of recurrence today - Recommend regular full body skin exams - Recommend daily broad spectrum sunscreen SPF 30+ to sun-exposed areas, reapply every 2 hours as needed.  - Call if any new or changing lesions are noted between office visits  INFLAMED SEBORRHEIC KERATOSIS  Symptomatic, irritating, patient would like treated.  Benign-appearing.  Call clinic for new or changing lesions.   Prior to procedure, discussed risks of blister formation, small wound, skin dyspigmentation, or rare scar following treatment. Recommend Vaseline ointment to treated areas while healing.  Destruction Procedure Note Destruction method: cryotherapy   Informed consent: discussed and consent obtained   Lesion destroyed using liquid nitrogen: Yes   Cryotherapy cycles:  1 Outcome: patient tolerated procedure well with no complications   Post-procedure details: wound care instructions given   Locations: left upper abdomen , left inguinal fold x 1 # of Lesions Treated: 2  Dermatitis (nummular)  Exam:  Pink scaly patch at upper back  Chronic  and persistent condition with duration or expected duration over one year. Condition is symptomatic/ bothersome to patient. Not currently at goal.    Treatment Plan:  Apply triamcinolone 0.1% cream twice a day as needed to affected areas. Avoid applying to face, groin, and axilla. Use as directed. Long-term use can cause thinning of the skin.  Topical steroids (such as triamcinolone, fluocinolone, fluocinonide, mometasone, clobetasol, halobetasol, betamethasone, hydrocortisone) can cause thinning and lightening of the skin if they are used for too long in the same area. Your physician has selected the right strength medicine for your problem and area affected on the body. Please use your medication only as directed by your physician to prevent side effects.   Recommend mild soap and moisturizing cream 1-2 times daily.   Skin cancer screening performed today.  Return in about 1 year (around 10/09/2023) for TBSE with Dr. Dara Lords, Ruthell Rummage, CMA, am acting as scribe for Brendolyn Patty, MD.   Documentation: I have reviewed the above documentation for accuracy and completeness, and I agree with the above.  Brendolyn Patty, MD

## 2023-06-06 ENCOUNTER — Emergency Department: Payer: BC Managed Care – PPO

## 2023-06-06 ENCOUNTER — Other Ambulatory Visit: Payer: Self-pay

## 2023-06-06 ENCOUNTER — Emergency Department
Admission: EM | Admit: 2023-06-06 | Discharge: 2023-06-06 | Disposition: A | Discharge status: Home / Self Care | Payer: BC Managed Care – PPO | Attending: Emergency Medicine | Admitting: Emergency Medicine

## 2023-06-06 DIAGNOSIS — I10 Essential (primary) hypertension: Secondary | ICD-10-CM | POA: Diagnosis not present

## 2023-06-06 DIAGNOSIS — F419 Anxiety disorder, unspecified: Secondary | ICD-10-CM

## 2023-06-06 DIAGNOSIS — R519 Headache, unspecified: Secondary | ICD-10-CM | POA: Diagnosis present

## 2023-06-06 DIAGNOSIS — H539 Unspecified visual disturbance: Secondary | ICD-10-CM

## 2023-06-06 LAB — CBC WITH DIFFERENTIAL/PLATELET
Abs Immature Granulocytes: 0.01 10*3/uL (ref 0.00–0.07)
Basophils Absolute: 0.1 10*3/uL (ref 0.0–0.1)
Basophils Relative: 1 %
Eosinophils Absolute: 0.2 10*3/uL (ref 0.0–0.5)
Eosinophils Relative: 3 %
HCT: 38.1 % (ref 36.0–46.0)
Hemoglobin: 12.4 g/dL (ref 12.0–15.0)
Immature Granulocytes: 0 %
Lymphocytes Relative: 34 %
Lymphs Abs: 2.4 10*3/uL (ref 0.7–4.0)
MCH: 27.4 pg (ref 26.0–34.0)
MCHC: 32.5 g/dL (ref 30.0–36.0)
MCV: 84.3 fL (ref 80.0–100.0)
Monocytes Absolute: 0.8 10*3/uL (ref 0.1–1.0)
Monocytes Relative: 12 %
Neutro Abs: 3.6 10*3/uL (ref 1.7–7.7)
Neutrophils Relative %: 50 %
Platelets: 416 10*3/uL — ABNORMAL HIGH (ref 150–400)
RBC: 4.52 MIL/uL (ref 3.87–5.11)
RDW: 12.1 % (ref 11.5–15.5)
WBC: 7 10*3/uL (ref 4.0–10.5)
nRBC: 0 % (ref 0.0–0.2)

## 2023-06-06 LAB — COMPREHENSIVE METABOLIC PANEL
ALT: 31 U/L (ref 0–44)
AST: 25 U/L (ref 15–41)
Albumin: 3.7 g/dL (ref 3.5–5.0)
Alkaline Phosphatase: 81 U/L (ref 38–126)
Anion gap: 9 (ref 5–15)
BUN: 16 mg/dL (ref 6–20)
CO2: 23 mmol/L (ref 22–32)
Calcium: 9.2 mg/dL (ref 8.9–10.3)
Chloride: 104 mmol/L (ref 98–111)
Creatinine, Ser: 0.76 mg/dL (ref 0.44–1.00)
GFR, Estimated: >60 mL/min (ref >60)
Glucose, Bld: 100 mg/dL — ABNORMAL HIGH (ref 70–99)
Potassium: 3.8 mmol/L (ref 3.5–5.1)
Sodium: 136 mmol/L (ref 135–145)
Total Bilirubin: <0.2 mg/dL (ref <1.2)
Total Protein: 7.8 g/dL (ref 6.5–8.1)

## 2023-06-06 LAB — TROPONIN I (HIGH SENSITIVITY): Troponin I (High Sensitivity): 3 ng/L (ref <18)

## 2023-06-06 MED ORDER — HYDROXYZINE HCL 25 MG PO TABS: 25.0000 mg | ORAL_TABLET | Freq: Three times a day (TID) | ORAL | 0 refills | Status: AC | PRN

## 2023-06-06 NOTE — ED Provider Notes (Signed)
[facility] Provider Note    Event Date/Time   First MD Initiated Contact with Patient 06/06/23 1506     (approximate)  History   Chief Complaint: Chest Pain  HPI  Christine Abbott is a 47 y.o. female with past medical history of anxiety, anemia, hypertension, presents to the emergency department for high blood pressure and visual changes.  According to the patient she was at work working on her computer when she had a sparkling sensation in her vision.  Patient states this then set off her anxiety.  States she has had headache today as well.  Patient was concerned that her blood pressure could be elevated and she states she recently changed blood pressure medications by her PCP Dr. Judithann Sheen.  No chest pain no shortness of breath currently although states at the time she did have some pressure and dyspnea.  Physical Exam   Triage Vital Signs: ED Triage Vitals  Encounter Vitals Group     BP 06/06/23 1309 (!) 146/104     Systolic BP Percentile --      Diastolic BP Percentile --      Pulse Rate 06/06/23 1309 63     Resp 06/06/23 1309 18     Temp 06/06/23 1309 98 F (36.7 C)     Temp Source 06/06/23 1309 Oral     SpO2 06/06/23 1309 96 %     Weight --      Height --      Head Circumference --      Peak Flow --      Pain Score 06/06/23 1310 2     Pain Loc --      Pain Education --      Exclude from Growth Chart --     Most recent vital signs: Vitals:   06/06/23 1309  BP: (!) 146/104  Pulse: 63  Resp: 18  Temp: 98 F (36.7 C)  SpO2: 96%    General: Awake, no distress.  CV:  Good peripheral perfusion.  Regular rate and rhythm  Resp:  Normal effort.  Equal breath sounds bilaterally.  Abd:  No distention.  Soft, nontender.  No rebound or guarding.  ED Results / Procedures / Treatments   EKG  EKG viewed and interpreted by myself shows sinus bradycardia 57 bpm with a narrow QRS, normal axis, normal intervals, no concerning ST  changes.  RADIOLOGY  I have reviewed and interpreted chest x-ray images.  No consolidation on my evaluation. Radiology is read the x-ray is negative CT scan of the head is negative besides left mastoid opacification.  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  No mastoiditis.  MEDICATIONS ORDERED IN ED: Medications - No data to display   IMPRESSION / MDM / ASSESSMENT AND PLAN / ED COURSE  I reviewed the triage vital signs and the nursing notes.  Patient's presentation is most consistent with acute presentation with potential threat to life or bodily function.  Patient presents to the emergency department for some sparkling visual disturbance followed by headache.  Patient states her anxiety was elevated and she was having some chest tightness as well.  Patient was concerned that her blood pressure could be elevated.  Patient's workup in the emergency department is reassuring with a normal CT scan of the head besides some left ear findings patient has a chronic history of left ear issues, states when she was pregnant she  would lose hearing in her left ear but it came back after her pregnancy, states chronic ear infections and tubes x 3 as a child.  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  No mastoiditis no pain.  Patient's lab work is reassuring with a normal CBC normal chemistry negative troponin.  Given the patient's reassuring workup highly suspect patient could be experiencing more anxiety possible aura prior to headache.  Patient does admit she believes much of her symptoms are anxiety related.  States she used to be on Xanax but stopped that and does not want to be on a addictive medication.  I believe a trial of hydroxyzine would be warranted for the patient.  Patient's blood pressure in the emergency department 146/104, not concerning [place] given the patient's recent change in blood pressure medications I  would hold off on adjusting her medications.  She has a follow-up appointment in 10 days with her PCP.  Visual change had since resolved.  States that seem to occur in both eyes.  No concern for retinal issue.  No symptoms currently.  We will have the patient follow-up with her PCP.  FINAL CLINICAL IMPRESSION(S) / ED DIAGNOSES   Chest pain Hypertension Visual change  Rx / DC Orders   Hydroxyzine  Note:  This document was prepared using Dragon voice recognition software and may include unintentional dictation errors.   Minna Antis, MD 06/06/23 1547

## 2023-06-06 NOTE — ED Triage Notes (Signed)
Pt to ED via POV from home. Pt reports intermittent CP and HA that she has been seen by Dr. Judithann Sheen. Pt reports bilaterally blurry vision that started today. Pt denies N/V. Pt reports vision is better but still blurry. Pt reports hx of HTN. Pt denies blood thinner.

## 2023-06-06 NOTE — ED Notes (Signed)
 Pt d/c home per EDP order. Discharge summary reviewed, pt verbalizes understanding. Ambulatory off unit. NAD.

## 2023-06-06 NOTE — ED Notes (Signed)
RN to bedside to introduce self to pt. Pt is resting , caox4 and in no acute distress.

## 2023-06-06 NOTE — ED Provider Triage Note (Signed)
Emergency Medicine Provider Triage Evaluation Note  Christine Abbott , a 47 y.o. female  was evaluated in triage.  Pt complains of chest pain and blurry vision. She has had CP occasionally, blurry vision began today. Blurry in both eyes. Pain can radiate to left shoulder.   Review of Systems  Positive: Headache, cp, blurry vision, dizziness Negative: N/v  Physical Exam  There were no vitals taken for this visit. Gen:   Awake, no distress   Resp:  Normal effort  MSK:   Moves extremities without difficulty  Other:    Medical Decision Making  Medically screening exam initiated at 1:06 PM.  Appropriate orders placed.  Christine Abbott was informed that the remainder of the evaluation will be completed by another provider, this initial triage assessment does not replace that evaluation, and the importance of remaining in the ED until their evaluation is complete.     Christine Ali, PA-C 06/06/23 1311

## 2023-06-23 ENCOUNTER — Other Ambulatory Visit: Payer: Self-pay | Admitting: Internal Medicine

## 2023-06-23 DIAGNOSIS — Z1231 Encounter for screening mammogram for malignant neoplasm of breast: Secondary | ICD-10-CM

## 2023-06-24 ENCOUNTER — Other Ambulatory Visit: Payer: Self-pay | Admitting: Internal Medicine

## 2023-06-24 DIAGNOSIS — N6489 Other specified disorders of breast: Secondary | ICD-10-CM

## 2023-07-24 ENCOUNTER — Inpatient Hospital Stay: Admission: RE | Admit: 2023-07-24 | Payer: BC Managed Care – PPO | Source: Ambulatory Visit

## 2023-07-24 ENCOUNTER — Other Ambulatory Visit: Payer: BC Managed Care – PPO

## 2023-08-27 ENCOUNTER — Ambulatory Visit
Admission: RE | Admit: 2023-08-27 | Discharge: 2023-08-27 | Disposition: A | Discharge status: Home / Self Care | Payer: BC Managed Care – PPO | Source: Ambulatory Visit | Attending: Internal Medicine | Admitting: Internal Medicine

## 2023-08-27 ENCOUNTER — Other Ambulatory Visit: Payer: BC Managed Care – PPO

## 2023-08-27 ENCOUNTER — Other Ambulatory Visit: Payer: Self-pay | Admitting: Internal Medicine

## 2023-08-27 DIAGNOSIS — N6489 Other specified disorders of breast: Secondary | ICD-10-CM

## 2023-10-15 ENCOUNTER — Ambulatory Visit: Payer: BC Managed Care – PPO | Admitting: Dermatology

## 2023-11-17 ENCOUNTER — Ambulatory Visit: Admitting: Dermatology

## 2023-12-23 ENCOUNTER — Ambulatory Visit: Admitting: Dermatology

## 2023-12-23 ENCOUNTER — Encounter: Payer: Self-pay | Admitting: Dermatology

## 2023-12-23 DIAGNOSIS — L738 Other specified follicular disorders: Secondary | ICD-10-CM

## 2023-12-23 DIAGNOSIS — L814 Other melanin hyperpigmentation: Secondary | ICD-10-CM | POA: Diagnosis not present

## 2023-12-23 DIAGNOSIS — Z86018 Personal history of other benign neoplasm: Secondary | ICD-10-CM

## 2023-12-23 DIAGNOSIS — W908XXA Exposure to other nonionizing radiation, initial encounter: Secondary | ICD-10-CM | POA: Diagnosis not present

## 2023-12-23 DIAGNOSIS — C4491 Basal cell carcinoma of skin, unspecified: Secondary | ICD-10-CM

## 2023-12-23 DIAGNOSIS — D1801 Hemangioma of skin and subcutaneous tissue: Secondary | ICD-10-CM

## 2023-12-23 DIAGNOSIS — L821 Other seborrheic keratosis: Secondary | ICD-10-CM | POA: Diagnosis not present

## 2023-12-23 DIAGNOSIS — L820 Inflamed seborrheic keratosis: Secondary | ICD-10-CM

## 2023-12-23 DIAGNOSIS — C44612 Basal cell carcinoma of skin of right upper limb, including shoulder: Secondary | ICD-10-CM | POA: Diagnosis not present

## 2023-12-23 DIAGNOSIS — L578 Other skin changes due to chronic exposure to nonionizing radiation: Secondary | ICD-10-CM | POA: Diagnosis not present

## 2023-12-23 DIAGNOSIS — D225 Melanocytic nevi of trunk: Secondary | ICD-10-CM

## 2023-12-23 DIAGNOSIS — D229 Melanocytic nevi, unspecified: Secondary | ICD-10-CM

## 2023-12-23 DIAGNOSIS — D492 Neoplasm of unspecified behavior of bone, soft tissue, and skin: Secondary | ICD-10-CM | POA: Diagnosis not present

## 2023-12-23 DIAGNOSIS — Z1283 Encounter for screening for malignant neoplasm of skin: Secondary | ICD-10-CM

## 2023-12-23 HISTORY — DX: Basal cell carcinoma of skin, unspecified: C44.91

## 2023-12-23 NOTE — Patient Instructions (Addendum)

## 2023-12-23 NOTE — Progress Notes (Signed)
 Follow-Up Visit   Subjective  Christine Abbott is a 48 y.o. female who presents for the following: Skin Cancer Screening and Full Body Skin Exam hx of Dysplastic Nevi, check spots R arm, R lower leg come and go itchy, iitchy spots bil legs  The patient presents for Total-Body Skin Exam (TBSE) for skin cancer screening and mole check. The patient has spots, moles and lesions to be evaluated, some may be new or changing and the patient may have concern these could be cancer.  The following portions of the chart were reviewed this encounter and updated as appropriate: medications, allergies, medical history  Review of Systems:  No other skin or systemic complaints except as noted in HPI or Assessment and Plan.  Objective  Well appearing patient in no apparent distress; mood and affect are within normal limits.  A full examination was performed including scalp, head, eyes, ears, nose, lips, neck, chest, axillae, abdomen, back, buttocks, bilateral upper extremities, bilateral lower extremities, hands, feet, fingers, toes, fingernails, and toenails. All findings within normal limits unless otherwise noted below.   Relevant physical exam findings are noted in the Assessment and Plan.  R ant distal deltoid 1.5cm pink patch  R low back 9.0cm lat to spine 0.6cm irregular brown macule  L infraxillary lat breast Irregular brown macule 0.6cm  LLQA Irregular brown macule 0.6cm  R middle medial pretibial Hyperkeratotic patch 1.1cm  R calf x 1, L lat thigh x 2 (3) Stuck on waxy paps with erythema  Assessment & Plan   SKIN CANCER SCREENING PERFORMED TODAY.  ACTINIC DAMAGE - Chronic condition, secondary to cumulative UV/sun exposure - diffuse scaly erythematous macules with underlying dyspigmentation - Recommend daily broad spectrum sunscreen SPF 30+ to sun-exposed areas, reapply every 2 hours as needed.  - Staying in the shade or wearing long sleeves, sun glasses (UVA+UVB protection)  and wide brim hats (4-inch brim around the entire circumference of the hat) are also recommended for sun protection.  - Call for new or changing lesions.  LENTIGINES, SEBORRHEIC KERATOSES, HEMANGIOMAS - Benign normal skin lesions - Benign-appearing - Call for any changes  MELANOCYTIC NEVI - Tan-brown and/or pink-flesh-colored symmetric macules and papules - Benign appearing on exam today - Observation - Call clinic for new or changing moles - Recommend daily use of broad spectrum spf 30+ sunscreen to sun-exposed areas.   HISTORY OF DYSPLASTIC NEVUS No evidence of recurrence today Recommend regular full body skin exams Recommend daily broad spectrum sunscreen SPF 30+ to sun-exposed areas, reapply every 2 hours as needed.  Call if any new or changing lesions are noted between office visits   Sebaceous Hyperplasia Forehead  - Small yellow papules with a central dell - Benign-appearing - Observe. Call for changes.   NEOPLASM OF SKIN (5) R ant distal deltoid Skin / nail biopsy Type of biopsy: tangential   Informed consent: discussed and consent obtained   Timeout: patient name, date of birth, surgical site, and procedure verified   Procedure prep:  Patient was prepped and draped in usual sterile fashion Prep type:  Isopropyl alcohol Anesthesia: the lesion was anesthetized in a standard fashion   Anesthetic:  1% lidocaine  w/ epinephrine 1-100,000 buffered w/ 8.4% NaHCO3 Instrument used: flexible razor blade   Outcome: patient tolerated procedure well   Post-procedure details: sterile dressing applied and wound care instructions given   Dressing type: bandage and bacitracin   Specimen 1 - Surgical pathology Differential Diagnosis: R/O BCC  Check Margins: No 1.5cm pink  patch Small specimen  R low back 9.0cm lat to spine Epidermal / dermal shaving  Lesion diameter (cm):  0.6 Informed consent: discussed and consent obtained   Timeout: patient name, date of birth, surgical  site, and procedure verified   Procedure prep:  Patient was prepped and draped in usual sterile fashion Prep type:  Isopropyl alcohol Anesthesia: the lesion was anesthetized in a standard fashion   Anesthetic:  1% lidocaine  w/ epinephrine 1-100,000 buffered w/ 8.4% NaHCO3 Instrument used: flexible razor blade   Hemostasis achieved with: pressure, aluminum chloride and electrodesiccation   Outcome: patient tolerated procedure well   Post-procedure details: sterile dressing applied and wound care instructions given   Dressing type: bandage and bacitracin   Specimen 2 - Surgical pathology Differential Diagnosis: Nevus vs Dysplastic Nevus  Check Margins: yes 0.6cm irregular brown macule L infraxillary lat breast Epidermal / dermal shaving  Lesion diameter (cm):  0.6 Informed consent: discussed and consent obtained   Timeout: patient name, date of birth, surgical site, and procedure verified   Procedure prep:  Patient was prepped and draped in usual sterile fashion Prep type:  Isopropyl alcohol Anesthesia: the lesion was anesthetized in a standard fashion   Anesthetic:  1% lidocaine  w/ epinephrine 1-100,000 buffered w/ 8.4% NaHCO3 Instrument used: flexible razor blade   Hemostasis achieved with: pressure, aluminum chloride and electrodesiccation   Outcome: patient tolerated procedure well   Post-procedure details: sterile dressing applied and wound care instructions given   Dressing type: bandage and bacitracin   Specimen 3 - Surgical pathology Differential Diagnosis:  Nevus vs Dysplastic Nevus  Check Margins: yes Irregular brown macule 0.6cm LLQA Epidermal / dermal shaving  Lesion diameter (cm):  0.6 Informed consent: discussed and consent obtained   Timeout: patient name, date of birth, surgical site, and procedure verified   Procedure prep:  Patient was prepped and draped in usual sterile fashion Prep type:  Isopropyl alcohol Anesthesia: the lesion was anesthetized in a  standard fashion   Anesthetic:  1% lidocaine  w/ epinephrine 1-100,000 buffered w/ 8.4% NaHCO3 Instrument used: flexible razor blade   Hemostasis achieved with: pressure, aluminum chloride and electrodesiccation   Outcome: patient tolerated procedure well   Post-procedure details: sterile dressing applied and wound care instructions given   Dressing type: bandage and bacitracin   Specimen 4 - Surgical pathology Differential Diagnosis:  Nevus vs Dysplastic Nevus  Check Margins: yes Irregular brown macule 0.6cm R middle medial pretibial Epidermal / dermal shaving  Lesion diameter (cm):  1.1 Informed consent: discussed and consent obtained   Timeout: patient name, date of birth, surgical site, and procedure verified   Procedure prep:  Patient was prepped and draped in usual sterile fashion Prep type:  Isopropyl alcohol Anesthesia: the lesion was anesthetized in a standard fashion   Anesthetic:  1% lidocaine  w/ epinephrine 1-100,000 buffered w/ 8.4% NaHCO3 Instrument used: flexible razor blade   Hemostasis achieved with: pressure, aluminum chloride and electrodesiccation   Outcome: patient tolerated procedure well   Post-procedure details: sterile dressing applied and wound care instructions given   Dressing type: bandage and bacitracin    Destruction of lesion Complexity: extensive   Destruction method: electrodesiccation and curettage   Informed consent: discussed and consent obtained   Timeout:  patient name, date of birth, surgical site, and procedure verified Procedure prep:  Patient was prepped and draped in usual sterile fashion Prep type:  Isopropyl alcohol Anesthesia: the lesion was anesthetized in a standard fashion   Anesthetic:  1% lidocaine   w/ epinephrine 1-100,000 buffered w/ 8.4% NaHCO3 Curettage performed in three different directions: Yes   Electrodesiccation performed over the curetted area: Yes   Lesion length (cm):  1.1 Lesion width (cm):  1.1 Margin per side  (cm):  0.2 Final wound size (cm):  1.5 Hemostasis achieved with:  pressure, aluminum chloride and electrodesiccation Outcome: patient tolerated procedure well with no complications   Post-procedure details: sterile dressing applied and wound care instructions given   Dressing type: bandage and bacitracin   Specimen 5 - Surgical pathology Differential Diagnosis: ISK r/o SCC  Check Margins: yes Hyperkeratotic patch 1.1cm EDC INFLAMED SEBORRHEIC KERATOSIS (3) R calf x 1, L lat thigh x 2 (3) Symptomatic, irritating, patient would like treated. Destruction of lesion - R calf x 1, L lat thigh x 2 (3) Complexity: simple   Destruction method: cryotherapy   Informed consent: discussed and consent obtained   Timeout:  patient name, date of birth, surgical site, and procedure verified Lesion destroyed using liquid nitrogen: Yes   Region frozen until ice ball extended beyond lesion: Yes   Outcome: patient tolerated procedure well with no complications   Post-procedure details: wound care instructions given   SKIN CANCER SCREENING   HISTORY OF DYSPLASTIC NEVUS   ACTINIC SKIN DAMAGE   LENTIGO   MELANOCYTIC NEVUS, UNSPECIFIED LOCATION    Return in about 1 year (around 12/22/2024) for TBSE, Hx of Dysplastic nevi.  I, Rollie Clipper, RMA, am acting as scribe for Celine Collard, MD .   Documentation: I have reviewed the above documentation for accuracy and completeness, and I agree with the above.  Celine Collard, MD

## 2023-12-26 LAB — SURGICAL PATHOLOGY

## 2023-12-29 ENCOUNTER — Ambulatory Visit: Payer: Self-pay | Admitting: Dermatology

## 2023-12-30 ENCOUNTER — Encounter: Payer: Self-pay | Admitting: Dermatology

## 2023-12-30 NOTE — Telephone Encounter (Signed)
 Left voicemail to return my call

## 2023-12-30 NOTE — Telephone Encounter (Signed)
-----   Message from Celine Collard sent at 12/29/2023  8:07 PM EDT ----- FINAL DIAGNOSIS        1. Skin, R ant distal deltoid :       SUPERFICIAL BASAL CELL CARCINOMA        2. 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 Skin, L infraxillary lat breast :       IRRITATED MELANOCYTIC NEVUS, COMPOUND LENTIGINOUS TYPE        4. Skin, LLQA :       SOLAR LENTIGO AND EARLY SEBORRHEIC KERATOSIS        5. Skin, R middle medial pretibial :       MACULAR SEBORRHEIC KERATOSIS, INFLAMED   1- Cancer = BCC Superficial Schedule for treatment (EDC) 2- Mld dysplastic Recheck next visit 3- benign irritated "mole" No further treatment needed 4,5 - both benign keratosis No further treatment needed

## 2023-12-31 ENCOUNTER — Encounter: Payer: Self-pay | Admitting: Dermatology

## 2023-12-31 NOTE — Telephone Encounter (Addendum)
 Called and discussed results with patient. She verbalized understanding. Patient scheduled for [facility] to treat right anterior distal deltoid August 12 at 8:45 am , will recheck right low back at next follow up. ----- Message from Celine Collard sent at 12/29/2023  8:07 PM EDT ----- FINAL DIAGNOSIS        1. Skin, R ant distal deltoid :       SUPERFICIAL BASAL CELL CARCINOMA        2. 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 Skin, L infraxillary lat breast :       IRRITATED MELANOCYTIC NEVUS, COMPOUND LENTIGINOUS TYPE        4. Skin, LLQA :       SOLAR LENTIGO AND EARLY SEBORRHEIC KERATOSIS        5. Skin, R middle medial pretibial :       MACULAR SEBORRHEIC KERATOSIS, INFLAMED   1- Cancer = BCC Superficial Schedule for treatment (EDC) 2- Mld dysplastic Recheck next visit 3- benign irritated mole No further treatment needed 4,5 - both benign keratosis No further treatment needed

## 2024-03-02 ENCOUNTER — Encounter: Payer: Self-pay | Admitting: Dermatology

## 2024-03-02 ENCOUNTER — Ambulatory Visit: Admitting: Dermatology

## 2024-03-02 DIAGNOSIS — Z86018 Personal history of other benign neoplasm: Secondary | ICD-10-CM

## 2024-03-02 DIAGNOSIS — Z7189 Other specified counseling: Secondary | ICD-10-CM

## 2024-03-02 DIAGNOSIS — W908XXA Exposure to other nonionizing radiation, initial encounter: Secondary | ICD-10-CM

## 2024-03-02 DIAGNOSIS — C44612 Basal cell carcinoma of skin of right upper limb, including shoulder: Secondary | ICD-10-CM

## 2024-03-02 DIAGNOSIS — L578 Other skin changes due to chronic exposure to nonionizing radiation: Secondary | ICD-10-CM | POA: Diagnosis not present

## 2024-03-02 DIAGNOSIS — L820 Inflamed seborrheic keratosis: Secondary | ICD-10-CM | POA: Diagnosis not present

## 2024-03-02 DIAGNOSIS — L821 Other seborrheic keratosis: Secondary | ICD-10-CM

## 2024-03-02 NOTE — Progress Notes (Signed)
   Follow-Up Visit   Subjective  Christine Abbott is a 48 y.o. female who presents for the following: BCC bx proven R ant distal deltoid, pt presents for [facility] today,   The following portions of the chart were reviewed this encounter and updated as appropriate: medications, allergies, medical history  Review of Systems:  No other skin or systemic complaints except as noted in HPI or Assessment and Plan.  Objective  Well appearing patient in no apparent distress; mood and affect are within normal limits.  A focused examination was performed of the following areas: Back, arms, hands  Relevant exam findings are noted in the Assessment and Plan.  R ant distal deltoid Pink bx site  Assessment & Plan   HISTORY OF DYSPLASTIC NEVUS No evidence of recurrence today Recommend regular full body skin exams Recommend daily broad spectrum sunscreen SPF 30+ to sun-exposed areas, reapply every 2 hours as needed.  Call if any new or changing lesions are noted between office visits  - R low back 9.0cm lat to spine  BASAL CELL CARCINOMA (BCC) OF SKIN OF RIGHT UPPER EXTREMITY INCLUDING SHOULDER R ant distal deltoid Destruction of lesion Complexity: extensive   Destruction method: electrodesiccation and curettage   Informed consent: discussed and consent obtained   Timeout:  patient name, date of birth, surgical site, and procedure verified Procedure prep:  Patient was prepped and draped in usual sterile fashion Prep type:  Isopropyl alcohol Anesthesia: the lesion was anesthetized in a standard fashion   Anesthetic:  1% lidocaine  w/ epinephrine 1-100,000 buffered w/ 8.4% NaHCO3 Curettage performed in three different directions: Yes   Electrodesiccation performed over the curetted area: Yes   Final wound size (cm):  1.7 Hemostasis achieved with:  pressure, aluminum chloride and electrodesiccation Outcome: patient tolerated procedure well with no complications   Post-procedure details: sterile  dressing applied and wound care instructions given   Dressing type: bandage and bacitracin     INFLAMED SEBORRHEIC KERATOSES  Arms, hands Exam: Stuck on waxy paps with erythema Treatment Plan: Discussed LN2, pt declines  ACTINIC DAMAGE - chronic, secondary to cumulative UV radiation exposure/sun exposure over time - diffuse scaly erythematous macules with underlying dyspigmentation - Recommend daily broad spectrum sunscreen SPF 30+ to sun-exposed areas, reapply every 2 hours as needed.  - Recommend staying in the shade or wearing long sleeves, sun glasses (UVA+UVB protection) and wide brim hats (4-inch brim around the entire circumference of the hat). - Call for new or changing lesions.   Return for as scheduled for TBSE , Hx of BCC, Hx of Dysplastic nevi.  I, Grayce Saunas, RMA, am acting as scribe for Alm Rhyme, MD .   Documentation: I have reviewed the above documentation for accuracy and completeness, and I agree with the above.  Alm Rhyme, MD

## 2024-03-02 NOTE — Patient Instructions (Addendum)
 Wound Care Instructions  Cleanse wound gently with soap and water once a day then pat dry with clean gauze. Apply a thin coat of Petrolatum (petroleum jelly, Vaseline) over the wound (unless you have an allergy to this). We recommend that you use a new, sterile tube of Vaseline. Do not pick or remove scabs. Do not remove the yellow or white healing tissue from the base of the wound.  Cover the wound with fresh, clean, nonstick gauze and secure with paper tape. You may use Band-Aids in place of gauze and tape if the wound is small enough, but would recommend trimming much of the tape off as there is often too much. Sometimes Band-Aids can irritate the skin.  You should call the office for your biopsy report after 1 week if you have not already been contacted.  If you experience any problems, such as abnormal amounts of bleeding, swelling, significant bruising, significant pain, or evidence of infection, please call the office immediately.  FOR ADULT SURGERY PATIENTS: If you need something for pain relief you may take 1 extra strength Tylenol  (acetaminophen ) AND 2 Ibuprofen  (200mg  each) together every 4 hours as needed for pain. (do not take these if you are allergic to them or if you have a reason you should not take them.) Typically, you may only need pain medication for 1 to 3 days.   Recommend Serica moisturizing scar formula cream every night or [facility] brand or Mederma silicone scar sheet every night for the first year after a scar appears to help with scar remodeling if desired. Scars remodel on their own for a full year and will gradually improve in appearance over time.    Due to recent changes in healthcare laws, you may see results of your pathology and/or laboratory studies on MyChart before the doctors have had a chance to review them. We understand that in some cases there may be results that are confusing or concerning to you. Please understand that not all results are received at  the same time and often the doctors may need to interpret multiple results in order to provide you with the best plan of care or course of treatment. Therefore, we ask that you please give us  2 business days to thoroughly review all your results before contacting the office for clarification. Should we see a critical lab result, you will be contacted sooner.   If You Need Anything After Your Visit  If you have any questions or concerns for your doctor, please call our main line at [phone] and press option 4 to reach your doctor's medical assistant. If no one answers, please leave a voicemail as directed and we will return your call as soon as possible. Messages left after 4 pm will be answered the following business day.   You may also send us  a message via MyChart. We typically respond to MyChart messages within 1-2 business days.  For prescription refills, please ask your pharmacy to contact our office. Our fax number is [phone].  If you have an urgent issue when the clinic is closed that cannot wait until the next business day, you can page your doctor at the number below.    Please note that while we do our best to be available for urgent issues outside of office hours, we are not available 24/7.   If you have an urgent issue and are unable to reach us , you may choose to seek medical care at your doctor's office, retail clinic, urgent care center,  or emergency room.  If you have a medical emergency, please immediately call 911 or go to the emergency department.  Pager Numbers  - Dr. Hester: [phone]  - Dr. Jackquline: [phone]  - Dr. Claudene: [phone]   In the event of inclement weather, please call our main line at [phone] for an update on the status of any delays or closures.  Dermatology Medication Tips: Please keep the boxes that topical medications come in in order to help keep track of the instructions about where and how to use these. Pharmacies  typically print the medication instructions only on the boxes and not directly on the medication tubes.   If your medication is too expensive, please contact our office at [phone] option 4 or send us  a message through MyChart.   We are unable to tell what your co-pay for medications will be in advance as this is different depending on your insurance coverage. However, we may be able to find a substitute medication at lower cost or fill out paperwork to get insurance to cover a needed medication.   If a prior authorization is required to get your medication covered by your insurance company, please allow us  1-2 business days to complete this process.  Drug prices often vary depending on where the prescription is filled and some pharmacies may offer cheaper prices.  The website www.goodrx.com contains coupons for medications through different pharmacies. The prices here do not account for what the cost may be with help from insurance (it may be cheaper with your insurance), but the website can give you the price if you did not use any insurance.  - You can print the associated coupon and take it with your prescription to the pharmacy.  - You may also stop by our office during regular business hours and pick up a GoodRx coupon card.  - If you need your prescription sent electronically to a different pharmacy, notify our office through [facility] or by phone at [phone] option 4.     Si Usted Necesita Algo Despus de Su Visita  Tambin puede enviarnos un mensaje a travs de Clinical cytogeneticist. Por lo general respondemos a los mensajes de MyChart en el transcurso de 1 a 2 das hbiles.  Para renovar recetas, por favor pida a su farmacia que se ponga en contacto con nuestra oficina. Randi lakes de fax es [place] [phone].  Si tiene un asunto urgente cuando la clnica est cerrada y que no puede esperar hasta el siguiente da hbil, puede llamar/localizar a su doctor(a) al nmero que  aparece a continuacin.   Por favor, tenga en cuenta que aunque hacemos todo lo posible para estar disponibles para asuntos urgentes fuera del horario de [place], no estamos disponibles las 24 horas del da, los 7 [address]  [address] de la [place].   Si tiene un problema urgente y no puede comunicarse con nosotros, puede optar por buscar atencin mdica  en el consultorio de su doctor(a), en una clnica privada, en un centro de atencin urgente o en una sala de emergencias.  Si tiene Engineer, drilling, por favor llame inmediatamente al 911 o vaya a la sala de emergencias.  Nmeros de bper  - Dr. Hester: [phone]  - Dra. Jackquline: [phone]  - Dr. Claudene: [phone]   En caso de inclemencias del tiempo, por favor llame a landry capes principal al [phone] para una actualizacin sobre el [place] de cualquier retraso o cierre.  Consejos para la medicacin en dermatologa: Por favor, guarde las cajas en  las que vienen los medicamentos de uso tpico para ayudarle a seguir las instrucciones sobre dnde y cmo usarlos. Las farmacias generalmente imprimen las instrucciones del medicamento slo en las cajas y no directamente en los tubos del [place].   Si su medicamento es muy caro, por favor, pngase en contacto con landry rieger llamando al [phone] y presione la opcin 4 o envenos un mensaje a travs de Clinical cytogeneticist.   No podemos decirle cul ser su copago por los medicamentos por adelantado ya que esto es diferente dependiendo de la cobertura de su seguro. Sin embargo, es posible que podamos encontrar un medicamento sustituto a Audiological scientist un formulario para que el seguro cubra el medicamento que se considera necesario.   Si se requiere una autorizacin previa para que su compaa de seguros malta su medicamento, por favor permtanos de 1 a 2 das hbiles para completar este proceso.  Los precios de los medicamentos varan con frecuencia dependiendo del Environmental consultant de dnde se surte  la receta y alguna farmacias pueden ofrecer precios ms baratos.  El sitio web www.goodrx.com tiene cupones para medicamentos de Health and safety inspector. Los precios aqu no tienen en cuenta lo que podra costar con la ayuda del seguro (puede ser ms barato con su seguro), pero el sitio web puede darle el precio si no utiliz Tourist information centre manager.  - Puede imprimir el cupn correspondiente y llevarlo con su receta a la farmacia.  - Tambin puede pasar por nuestra oficina durante el horario de atencin regular y Education officer, museum una tarjeta de cupones de GoodRx.  - Si necesita que su receta se enve electrnicamente a una farmacia diferente, informe a nuestra oficina a travs de MyChart de [place] o por telfono llamando al [phone] y presione la opcin 4.

## 2024-05-25 ENCOUNTER — Other Ambulatory Visit: Payer: Self-pay | Admitting: Internal Medicine

## 2024-05-25 DIAGNOSIS — Z1231 Encounter for screening mammogram for malignant neoplasm of breast: Secondary | ICD-10-CM

## 2024-12-28 ENCOUNTER — Ambulatory Visit: Admitting: Dermatology
